# Patient Record
Sex: Female | Born: 1985 | Race: Black or African American | Hispanic: No | Marital: Married | Smoking: Never smoker
Health system: Southern US, Community
[De-identification: ages and names within clinical notes are randomized; demographics above are authoritative.]

## PROBLEM LIST (undated history)

## (undated) ENCOUNTER — Inpatient Hospital Stay (HOSPITAL_COMMUNITY): Payer: Self-pay

## (undated) DIAGNOSIS — O24419 Gestational diabetes mellitus in pregnancy, unspecified control: Secondary | ICD-10-CM

## (undated) DIAGNOSIS — J309 Allergic rhinitis, unspecified: Secondary | ICD-10-CM

## (undated) DIAGNOSIS — IMO0002 Reserved for concepts with insufficient information to code with codable children: Secondary | ICD-10-CM

## (undated) DIAGNOSIS — Z309 Encounter for contraceptive management, unspecified: Secondary | ICD-10-CM

## (undated) DIAGNOSIS — Z973 Presence of spectacles and contact lenses: Secondary | ICD-10-CM

## (undated) DIAGNOSIS — N946 Dysmenorrhea, unspecified: Secondary | ICD-10-CM

## (undated) DIAGNOSIS — E669 Obesity, unspecified: Secondary | ICD-10-CM

## (undated) DIAGNOSIS — I10 Essential (primary) hypertension: Secondary | ICD-10-CM

## (undated) DIAGNOSIS — J811 Chronic pulmonary edema: Secondary | ICD-10-CM

## (undated) DIAGNOSIS — K219 Gastro-esophageal reflux disease without esophagitis: Secondary | ICD-10-CM

## (undated) DIAGNOSIS — R87619 Unspecified abnormal cytological findings in specimens from cervix uteri: Secondary | ICD-10-CM

## (undated) HISTORY — DX: Dysmenorrhea, unspecified: N94.6

## (undated) HISTORY — PX: WISDOM TOOTH EXTRACTION: SHX21

## (undated) HISTORY — DX: Encounter for contraceptive management, unspecified: Z30.9

## (undated) HISTORY — DX: Allergic rhinitis, unspecified: J30.9

## (undated) HISTORY — DX: Obesity, unspecified: E66.9

## (undated) HISTORY — DX: Gestational diabetes mellitus in pregnancy, unspecified control: O24.419

## (undated) HISTORY — DX: Presence of spectacles and contact lenses: Z97.3

## (undated) HISTORY — DX: Chronic pulmonary edema: J81.1

---

## 2006-09-03 ENCOUNTER — Emergency Department (HOSPITAL_COMMUNITY): Admission: EM | Admit: 2006-09-03 | Discharge: 2006-09-04 | Payer: Self-pay | Admitting: Emergency Medicine

## 2010-03-11 ENCOUNTER — Emergency Department (HOSPITAL_COMMUNITY): Admission: EM | Admit: 2010-03-11 | Discharge: 2010-03-11 | Payer: Self-pay | Admitting: Emergency Medicine

## 2010-04-02 ENCOUNTER — Ambulatory Visit (HOSPITAL_COMMUNITY): Admission: RE | Admit: 2010-04-02 | Discharge: 2010-04-02 | Payer: Self-pay | Admitting: Obstetrics

## 2010-05-28 ENCOUNTER — Ambulatory Visit (HOSPITAL_COMMUNITY): Admission: RE | Admit: 2010-05-28 | Discharge: 2010-05-28 | Payer: Self-pay | Admitting: Obstetrics

## 2010-06-06 ENCOUNTER — Inpatient Hospital Stay (HOSPITAL_COMMUNITY): Admission: AD | Admit: 2010-06-06 | Discharge: 2010-06-07 | Payer: Self-pay | Admitting: Obstetrics & Gynecology

## 2010-06-14 ENCOUNTER — Ambulatory Visit (HOSPITAL_COMMUNITY): Admission: RE | Admit: 2010-06-14 | Discharge: 2010-06-14 | Payer: Self-pay | Admitting: Obstetrics

## 2010-07-20 ENCOUNTER — Inpatient Hospital Stay (HOSPITAL_COMMUNITY): Admission: AD | Admit: 2010-07-20 | Discharge: 2010-07-21 | Payer: Self-pay | Admitting: Obstetrics

## 2010-10-11 DIAGNOSIS — J811 Chronic pulmonary edema: Secondary | ICD-10-CM

## 2010-10-11 HISTORY — DX: Chronic pulmonary edema: J81.1

## 2010-10-26 ENCOUNTER — Inpatient Hospital Stay (HOSPITAL_COMMUNITY)
Admission: AD | Admit: 2010-10-26 | Discharge: 2010-10-29 | Payer: Self-pay | Source: Home / Self Care | Attending: Obstetrics | Admitting: Obstetrics

## 2010-11-01 ENCOUNTER — Emergency Department (HOSPITAL_COMMUNITY)
Admission: EM | Admit: 2010-11-01 | Discharge: 2010-11-01 | Payer: Self-pay | Source: Home / Self Care | Admitting: Emergency Medicine

## 2010-11-02 ENCOUNTER — Inpatient Hospital Stay (HOSPITAL_COMMUNITY)
Admission: AD | Admit: 2010-11-02 | Discharge: 2010-11-03 | Payer: Self-pay | Attending: Obstetrics | Admitting: Obstetrics

## 2011-01-21 LAB — CBC
HCT: 33.8 % — ABNORMAL LOW (ref 36.0–46.0)
Hemoglobin: 11.5 g/dL — ABNORMAL LOW (ref 12.0–15.0)
Hemoglobin: 11.1 g/dL — ABNORMAL LOW (ref 12.0–15.0)
MCH: 29.5 pg (ref 26.0–34.0)
MCHC: 34.6 g/dL (ref 30.0–36.0)
MCHC: 32.8 g/dL (ref 30.0–36.0)
MCHC: 33.5 g/dL (ref 30.0–36.0)
MCV: 89.9 fL (ref 78.0–100.0)
RBC: 3.76 MIL/uL — ABNORMAL LOW (ref 3.87–5.11)
RDW: 12.1 % (ref 11.5–15.5)
RDW: 12.7 % (ref 11.5–15.5)
WBC: 9.3 10*3/uL (ref 4.0–10.5)

## 2011-01-21 LAB — COMPREHENSIVE METABOLIC PANEL
ALT: 31 U/L (ref 0–35)
AST: 25 U/L (ref 0–37)
Alkaline Phosphatase: 122 U/L — ABNORMAL HIGH (ref 39–117)
BUN: 7 mg/dL (ref 6–23)
Calcium: 8.8 mg/dL (ref 8.4–10.5)
Chloride: 111 mEq/L (ref 96–112)
Creatinine, Ser: 0.71 mg/dL (ref 0.4–1.2)
Glucose, Bld: 77 mg/dL (ref 70–99)
Total Protein: 5.9 g/dL — ABNORMAL LOW (ref 6.0–8.3)

## 2011-01-21 LAB — DIFFERENTIAL
Basophils Absolute: 0 10*3/uL (ref 0.0–0.1)
Eosinophils Absolute: 0.4 10*3/uL (ref 0.0–0.7)
Eosinophils Relative: 6 % — ABNORMAL HIGH (ref 0–5)
Lymphocytes Relative: 31 % (ref 12–46)

## 2011-01-21 LAB — URINALYSIS, ROUTINE W REFLEX MICROSCOPIC
Bilirubin Urine: NEGATIVE
Ketones, ur: NEGATIVE mg/dL
Nitrite: NEGATIVE
Protein, ur: NEGATIVE mg/dL
pH: 7 (ref 5.0–8.0)

## 2011-01-21 LAB — URINE MICROSCOPIC-ADD ON

## 2011-01-21 LAB — D-DIMER, QUANTITATIVE: D-Dimer, Quant: 0.97 ug/mL-FEU — ABNORMAL HIGH (ref 0.00–0.48)

## 2011-01-24 LAB — URINALYSIS, ROUTINE W REFLEX MICROSCOPIC
Hgb urine dipstick: NEGATIVE
Specific Gravity, Urine: >1.03 — ABNORMAL HIGH (ref 1.005–1.030)

## 2011-01-24 LAB — WET PREP, GENITAL: Clue Cells Wet Prep HPF POC: NONE SEEN

## 2011-01-26 LAB — URINALYSIS, ROUTINE W REFLEX MICROSCOPIC
Hgb urine dipstick: NEGATIVE
Ketones, ur: 40 mg/dL — AB
Nitrite: NEGATIVE
Specific Gravity, Urine: >1.03 — ABNORMAL HIGH (ref 1.005–1.030)
Urobilinogen, UA: 0.2 mg/dL (ref 0.0–1.0)

## 2011-01-29 LAB — COMPREHENSIVE METABOLIC PANEL
AST: 18 U/L (ref 0–37)
CO2: 26 mEq/L (ref 19–32)
GFR calc non Af Amer: >60 mL/min (ref >60)
Potassium: 3.3 mEq/L — ABNORMAL LOW (ref 3.5–5.1)
Sodium: 131 mEq/L — ABNORMAL LOW (ref 135–145)
Total Bilirubin: 0.5 mg/dL (ref 0.3–1.2)

## 2011-01-29 LAB — URINALYSIS, ROUTINE W REFLEX MICROSCOPIC
Ketones, ur: 15 mg/dL — AB
Nitrite: NEGATIVE
Specific Gravity, Urine: 1.027 (ref 1.005–1.030)
pH: 6.5 (ref 5.0–8.0)

## 2011-01-29 LAB — DIFFERENTIAL
Basophils Absolute: 0 10*3/uL (ref 0.0–0.1)
Basophils Relative: 1 % (ref 0–1)
Lymphocytes Relative: 21 % (ref 12–46)
Monocytes Relative: 8 % (ref 3–12)
Neutro Abs: 4.7 10*3/uL (ref 1.7–7.7)
Neutrophils Relative %: 63 % (ref 43–77)

## 2011-01-29 LAB — CBC
HCT: 37.2 % (ref 36.0–46.0)
MCHC: 34.7 g/dL (ref 30.0–36.0)
MCV: 92.3 fL (ref 78.0–100.0)
Platelets: 226 10*3/uL (ref 150–400)
RBC: 4.03 MIL/uL (ref 3.87–5.11)

## 2011-02-26 ENCOUNTER — Emergency Department (HOSPITAL_COMMUNITY): Payer: Medicaid Other

## 2011-02-26 ENCOUNTER — Emergency Department (HOSPITAL_COMMUNITY)
Admission: EM | Admit: 2011-02-26 | Discharge: 2011-02-27 | Disposition: A | Discharge status: Home / Self Care | Payer: Medicaid Other | Source: Home / Self Care | Attending: Emergency Medicine | Admitting: Emergency Medicine

## 2011-02-26 DIAGNOSIS — R079 Chest pain, unspecified: Secondary | ICD-10-CM | POA: Insufficient documentation

## 2011-02-26 DIAGNOSIS — R0609 Other forms of dyspnea: Secondary | ICD-10-CM | POA: Insufficient documentation

## 2011-02-26 DIAGNOSIS — R059 Cough, unspecified: Secondary | ICD-10-CM | POA: Insufficient documentation

## 2011-02-26 DIAGNOSIS — R05 Cough: Secondary | ICD-10-CM | POA: Insufficient documentation

## 2011-02-26 DIAGNOSIS — R0989 Other specified symptoms and signs involving the circulatory and respiratory systems: Secondary | ICD-10-CM | POA: Insufficient documentation

## 2011-02-27 ENCOUNTER — Emergency Department (HOSPITAL_COMMUNITY)
Admission: RE | Admit: 2011-02-27 | Discharge: 2011-02-27 | Disposition: A | Discharge status: Home / Self Care | Payer: Medicaid Other | Source: Ambulatory Visit | Attending: Emergency Medicine | Admitting: Emergency Medicine

## 2011-02-27 ENCOUNTER — Emergency Department (HOSPITAL_COMMUNITY): Admit: 2011-02-27 | Payer: Medicaid Other

## 2011-02-27 DIAGNOSIS — R079 Chest pain, unspecified: Secondary | ICD-10-CM | POA: Insufficient documentation

## 2011-02-27 DIAGNOSIS — R059 Cough, unspecified: Secondary | ICD-10-CM | POA: Insufficient documentation

## 2011-02-27 DIAGNOSIS — R0602 Shortness of breath: Secondary | ICD-10-CM | POA: Insufficient documentation

## 2011-02-27 DIAGNOSIS — R05 Cough: Secondary | ICD-10-CM | POA: Insufficient documentation

## 2011-02-27 DIAGNOSIS — R0609 Other forms of dyspnea: Secondary | ICD-10-CM | POA: Insufficient documentation

## 2011-02-27 DIAGNOSIS — R0989 Other specified symptoms and signs involving the circulatory and respiratory systems: Secondary | ICD-10-CM | POA: Insufficient documentation

## 2011-02-27 LAB — POCT I-STAT, CHEM 8
Chloride: 103 mEq/L (ref 96–112)
Creatinine, Ser: 0.8 mg/dL (ref 0.4–1.2)
HCT: 39 % (ref 36.0–46.0)
Hemoglobin: 13.3 g/dL (ref 12.0–15.0)
Potassium: 4 mEq/L (ref 3.5–5.1)
Sodium: 140 mEq/L (ref 135–145)

## 2011-02-27 MED ORDER — IOHEXOL 300 MG/ML  SOLN
100.0000 mL | Freq: Once | INTRAMUSCULAR | Status: AC | PRN
  Administered 2011-02-27: 100 mL via INTRAVENOUS

## 2011-10-22 ENCOUNTER — Encounter: Payer: Self-pay | Admitting: *Deleted

## 2011-10-22 ENCOUNTER — Emergency Department (INDEPENDENT_AMBULATORY_CARE_PROVIDER_SITE_OTHER)
Admission: EM | Admit: 2011-10-22 | Discharge: 2011-10-22 | Disposition: A | Discharge status: Home / Self Care | Payer: Self-pay | Source: Home / Self Care | Attending: Family Medicine | Admitting: Family Medicine

## 2011-10-22 DIAGNOSIS — G5601 Carpal tunnel syndrome, right upper limb: Secondary | ICD-10-CM

## 2011-10-22 DIAGNOSIS — G5600 Carpal tunnel syndrome, unspecified upper limb: Secondary | ICD-10-CM

## 2011-10-22 HISTORY — DX: Gastro-esophageal reflux disease without esophagitis: K21.9

## 2011-10-22 NOTE — ED Notes (Signed)
3 weeks of right hand tingling/pain, especially in the middle and ring fingers, the tingling radiates to the right elbow.     The pain is worse at night, when it wakes her up while sleeping.  Denies recent injury.  Pt does use a keyboard at work

## 2011-10-23 NOTE — ED Provider Notes (Signed)
History     CSN: [id number] Arrival date & time: 10/22/2011 11:20 AM   First MD Initiated Contact with Patient 10/22/11 1200      Chief Complaint  Patient presents with  . Hand Pain    (Consider location/radiation/quality/duration/timing/severity/associated sxs/prior treatment) HPI Comments: HPI Comments: 25 y/o right handed female here c/o tingling type of pain in right wrist present for 3 weeks,  radiation downward to complete middle and ring finger in palmar area and distal phalanges dorsally. Upward radiation to medial right elbow. Tingling has been associated with numbness in same areas. No weakness but pain worse with wrist movent while picking up objects. No color or temperature changes. Works in Education officer, environmental all day. No prior episodes. No known recent injury. Pt has a history of right wrist fracture several years ago as per her report.    Past Medical History  Diagnosis Date  . GERD (gastroesophageal reflux disease)     Past Surgical History  Procedure Date  . Cesarean section     No family history on file.  History  Substance Use Topics  . Smoking status: Never Smoker   . Smokeless tobacco: Not on file  . Alcohol Use: Yes     occasional    OB History    Grav Para Term Preterm Abortions TAB SAB Ect Mult Living                  Review of Systems  Constitutional: Negative.   HENT: Negative for congestion, neck pain and neck stiffness.   Respiratory: Negative for cough, chest tightness and shortness of breath.   Skin: Negative for rash.  Neurological: Negative for weakness and headaches.       Intermittent paresthesias, tingling and numbness of right hand    Allergies  Review of patient's allergies indicates no known allergies.  Home Medications   Current Outpatient Rx  Name Route Sig Dispense Refill  . IBUPROFEN 800 MG PO TABS Oral Take 800 mg by mouth every 8 (eight) hours as needed.      Marland Kitchen OMEPRAZOLE 20 MG PO CPDR Oral Take 20 mg by  mouth daily.        BP 126/58  Pulse 99  Temp(Src) 98 F (36.7 C) (Oral)  Resp 20  SpO2 100%  LMP 07/22/2011  Physical Exam  Nursing note and vitals reviewed. Constitutional: She is oriented to person, place, and time. She appears well-developed and well-nourished. No distress.  HENT:  Head: Normocephalic and atraumatic.  Eyes: EOM are normal. Pupils are equal, round, and reactive to light.  Neck: Normal range of motion. Neck supple.       Negative spurling  Cardiovascular: Normal rate, regular rhythm and normal heart sounds.   Pulmonary/Chest: Breath sounds normal. She has no wheezes. She has no rales. She exhibits no tenderness.  Musculoskeletal:       Right Wrist: no obvious deformity, swelling or erythema. Full range of motion. Normal musculoskeletal exam of the right hand  Neurological: She is alert and oriented to person, place, and time.       Right hand: Positive phalens test. With reported pain at inner wrist with tingling sensation in 3th and 4th finger with complete palmar extension and dorsally to distal phalanges. No grip weakness compared with left but discomfort. Also exacerbation of tingling by tapping over inner wrist.  Skin: No rash noted.    ED Course  Procedures (including critical care time)  Labs Reviewed - No data to  display No results found.   1. Carpal tunnel syndrome of right wrist       MDM  wrist splint was placed. Treated with ibuprofen. Referral for hand ortho specialist given in case of persistent symptoms.        Sharin Grave, MD 10/24/11 [phone]

## 2012-01-29 ENCOUNTER — Encounter (HOSPITAL_COMMUNITY): Payer: Self-pay | Admitting: Emergency Medicine

## 2012-01-29 ENCOUNTER — Other Ambulatory Visit: Payer: Self-pay

## 2012-01-29 ENCOUNTER — Emergency Department (HOSPITAL_COMMUNITY)
Admission: EM | Admit: 2012-01-29 | Discharge: 2012-01-30 | Disposition: A | Discharge status: Home / Self Care | Payer: Self-pay | Attending: Emergency Medicine | Admitting: Emergency Medicine

## 2012-01-29 DIAGNOSIS — I498 Other specified cardiac arrhythmias: Secondary | ICD-10-CM | POA: Insufficient documentation

## 2012-01-29 DIAGNOSIS — R000 Tachycardia, unspecified: Secondary | ICD-10-CM

## 2012-01-29 DIAGNOSIS — I10 Essential (primary) hypertension: Secondary | ICD-10-CM | POA: Insufficient documentation

## 2012-01-29 DIAGNOSIS — K219 Gastro-esophageal reflux disease without esophagitis: Secondary | ICD-10-CM | POA: Insufficient documentation

## 2012-01-29 LAB — DIFFERENTIAL
Lymphs Abs: 2.6 10*3/uL (ref 0.7–4.0)
Monocytes Relative: 8 % (ref 3–12)
Neutro Abs: 3.8 10*3/uL (ref 1.7–7.7)
Neutrophils Relative %: 51 % (ref 43–77)

## 2012-01-29 LAB — CBC
Hemoglobin: 12.8 g/dL (ref 12.0–15.0)
RBC: 4.6 MIL/uL (ref 3.87–5.11)

## 2012-01-29 NOTE — ED Notes (Signed)
PT. CHECKED HER BLOOD PRESSURE AT [facility] THIS EVENING = 164/92 WITH PALPITATIONS , FEELS "TIRED' AND PRESSURE ON BOTH EARS.

## 2012-01-30 LAB — URINALYSIS, ROUTINE W REFLEX MICROSCOPIC
Bilirubin Urine: NEGATIVE
Hgb urine dipstick: NEGATIVE
Nitrite: NEGATIVE
Specific Gravity, Urine: 1.024 (ref 1.005–1.030)
pH: 5.5 (ref 5.0–8.0)

## 2012-01-30 LAB — BASIC METABOLIC PANEL
BUN: 11 mg/dL (ref 6–23)
CO2: 23 mEq/L (ref 19–32)
Chloride: 101 mEq/L (ref 96–112)
GFR calc Af Amer: >90 mL/min (ref >90)
Glucose, Bld: 103 mg/dL — ABNORMAL HIGH (ref 70–99)
Potassium: 3.5 mEq/L (ref 3.5–5.1)

## 2012-01-30 LAB — TSH: TSH: <0.008 u[IU]/mL — ABNORMAL LOW (ref 0.350–4.500)

## 2012-01-30 LAB — D-DIMER, QUANTITATIVE: D-Dimer, Quant: 0.34 ug/mL-FEU (ref 0.00–0.48)

## 2012-01-30 MED ORDER — SODIUM CHLORIDE 0.9 % IV BOLUS (SEPSIS)
1000.0000 mL | Freq: Once | INTRAVENOUS | Status: AC
  Administered 2012-01-30: 1000 mL via INTRAVENOUS

## 2012-01-30 MED ORDER — METOPROLOL TARTRATE 1 MG/ML IV SOLN
5.0000 mg | Freq: Once | INTRAVENOUS | Status: AC
  Administered 2012-01-30: 5 mg via INTRAVENOUS
  Filled 2012-01-30: qty 5

## 2012-01-30 MED ORDER — METOPROLOL TARTRATE 25 MG PO TABS: 25.0000 mg | ORAL_TABLET | Freq: Two times a day (BID) | ORAL | Status: DC

## 2012-01-30 MED ORDER — SODIUM CHLORIDE 0.9 % IV SOLN: INTRAVENOUS | Status: DC

## 2012-01-30 MED ORDER — METOPROLOL TARTRATE 25 MG PO TABS
25.0000 mg | ORAL_TABLET | Freq: Once | ORAL | Status: AC
  Administered 2012-01-30: 25 mg via ORAL
  Filled 2012-01-30: qty 1

## 2012-01-30 NOTE — Discharge Instructions (Signed)
Take medications as prescribed.  You have apparent sinus tachycardia, a faster than normal heart rate with otherwise normal conduction of electricity through the heart and no serious underlying cause could be found.  One test, a TSH level, is currently in progress.  Follow up with your primary care physician is very important to see if the TSH is low, indicating an overactive thyroid gland as the problem.  Otherwise, your doctor may have to perform more tests to determine the cause of your elevated heart rate.  If you develop chest pain, irregular heartbeat, shortness of breath, lightheadedness or feeling like you're going to pass out, return to the emergency department right away for further evaluation.  Arrhythmia Categories Your heart is a muscle that works to pump blood through your body by regular contractions. The beating of your heart is controlled by a system of special pacemaker cells. These cells control the electrical activity of the heart. When the system controlling this regular beating is disturbed, a heart rhythm abnormality (arrhythmia) results. WHEN YOUR HEART SKIPS A BEAT One of the most common and least serious heart arrhythmias is called an ectopic or premature atrial heartbeat (PAC). This may be noticed as a small change in your regular pulse. A PAC originates from the top part (atrium) of the heart. Within the right atrium, the SA node is the area that normally controls the regularity of the heart. PACs occur in heart tissue outside of the SA node region. You may feel this as a skipped beat or heart flutter, especially if several occur in succession or occur frequently.  Another arrhythmia is ventricular premature complex (VCP or PVC). These extra beats start out in the bottom, more muscular chambers of the heart. In most cases a PVC is harmless. If there are underlying causes that are making the heart irritable such as an overactive thyroid or a prior heart attack PVCs may be of more  concern. In a few cases, medications to control the heart rhythm may be prescribed. Things to try at home:  Cut down or avoid alcohol, tobacco and caffeine.   Get enough sleep.   Reduce stress.   Exercise more.  WHEN THE HEART BEATS TOO FAST Atrial tachycardia is a fast heart rate, which starts out in the atrium. It may last from minutes to much longer. Your heart may beat 140 to 240 times per minute instead of the normal 60 to 100.  Symptoms include a worried feeling (anxiety) and a sense that your heart is beating fast and hard.   You may be able to stop the fast rate by holding your breath or bearing down as if you were going to have a bowel movement.   This type of fast rate is usually not dangerous.  Atrial fibrillation and atrial flutter are other fast rhythms that start in the atria. Both conditions keep the atria from filling with enough blood so the heart does not work well.  Symptoms include feeling lightheaded or faint.   These fast rates may be the result of heart damage or disease. Too much thyroid hormone may play a role.   There may be no clear cause or it may be from heart disease or damage.   Medication or a special electrical treatment (cardioversion) may be needed to get the heart beating normally.  Ventricular tachycardia is a fast heart rate that starts in the lower muscular chambers (ventricles) This is a serious disorder that requires treatment as soon as possible. You need someone  else to get and use a small defibrillator.  Symptoms include collapse, chest pain, or being short of breath.   Treatment may include medication, procedures to improve blood flow to the heart, or an implantable cardiac defibrillator (ICD).  DIAGNOSIS   A cardiogram (EKG or ECG) will be done to see the arrhythmia, as well as lab tests to check the underlying cause.   If the extra beats or fast rate come and go, you may wear a Holter monitor that records your heart rate for a longer  period of time.  HOME CARE INSTRUCTIONS  If your caregiver has given you a follow-up appointment, it is very important to keep that appointment. Not keeping the appointment could result in a heart attack, permanent injury, pain, and disability. If there is any problem keeping the appointment, you must call back to this facility for assistance.  SEEK MEDICAL CARE IF:  You have irregular or fast heartbeats (palpitations).   You experience skipped beats.   You develop lightheadedness.   You have chest discomfort.   You develop shortness of breath.   You have more frequent episodes, if you are already being treated.  SEEK IMMEDIATE MEDICAL CARE IF:   You have severe chest pain, especially if the pain is crushing or pressure-like and spreads to the arms, back, neck, or jaw, or if you have sweating, feeling sick to your stomach (nausea), or shortness of breath. THIS IS AN EMERGENCY. Do not wait to see if the pain will go away. Get medical help at once. Call 911 or 0 (operator). DO NOT drive yourself to the hospital.   You feel dizzy or faint.   You have episodes of previously documented atrial tachycardia that do not resolve with the techniques your caregiver has taught you.   Irregular or rapid heartbeats begin to occur more often than in the past, especially if they are associated with more pronounced symptoms or of longer duration.  Document Released: 10/28/2005 Document Revised: 10/17/2011 Document Reviewed: 03/12/2007 [employer] Patient Information 2012 Exit[employer], LLCArterial Hypertension Arterial hypertension (high blood pressure) is a condition of elevated pressure in your blood vessels. Hypertension over a long period of time is a risk factor for strokes, heart attacks, and heart failure. It is also the leading cause of kidney (renal) failure.  CAUSES   In Adults -- Over 90% of all hypertension has no known cause. This is called essential or primary hypertension. In the other 10% of  people with hypertension, the increase in blood pressure is caused by another disorder. This is called secondary hypertension. Important causes of secondary hypertension are:   Heavy alcohol use.   Obstructive sleep apnea.   Hyperaldosterosim (Conn's syndrome).   Steroid use.   Chronic kidney failure.   Hyperparathyroidism.   Medications.   Renal artery stenosis.   Pheochromocytoma.   Cushing's disease.   Coarctation of the aorta.   Scleroderma renal crisis.   Licorice (in excessive amounts).   Drugs (cocaine, methamphetamine).  Your caregiver can explain any items above that apply to you.  In Children -- Secondary hypertension is more common and should always be considered.   Pregnancy -- Few women of childbearing age have high blood pressure. However, up to 10% of them develop hypertension of pregnancy. Generally, this will not harm the woman. It may be a sign of 3 complications of pregnancy: preeclampsia, HELLP syndrome, and eclampsia. Follow up and control with medication is necessary.  SYMPTOMS   This condition normally does not produce any  noticeable symptoms. It is usually found during a routine exam.   Malignant hypertension is a late problem of high blood pressure. It may have the following symptoms:   Headaches.   Blurred vision.   End-organ damage (this means your kidneys, heart, lungs, and other organs are being damaged).   Stressful situations can increase the blood pressure. If a person with normal blood pressure has their blood pressure go up while being seen by their caregiver, this is often termed "white coat hypertension." Its importance is not known. It may be related with eventually developing hypertension or complications of hypertension.   Hypertension is often confused with mental tension, stress, and anxiety.  DIAGNOSIS  The diagnosis is made by 3 separate blood pressure measurements. They are taken at least 1 week apart from each other. If  there is organ damage from hypertension, the diagnosis may be made without repeat measurements. Hypertension is usually identified by having blood pressure readings:  Above 140/90 mmHg measured in both arms, at 3 separate times, over a couple weeks.   Over 130/80 mmHg should be considered a risk factor and may require treatment in patients with diabetes.  Blood pressure readings over 120/80 mmHg are called "pre-hypertension" even in non-diabetic patients. To get a true blood pressure measurement, use the following guidelines. Be aware of the factors that can alter blood pressure readings.  Take measurements at least 1 hour after caffeine.   Take measurements 30 minutes after smoking and without any stress. This is another reason to quit smoking - it raises your blood pressure.   Use a proper cuff size. Ask your caregiver if you are not sure about your cuff size.   Most home blood pressure cuffs are automatic. They will measure systolic and diastolic pressures. The systolic pressure is the pressure reading at the start of sounds. Diastolic pressure is the pressure at which the sounds disappear. If you are elderly, measure pressures in multiple postures. Try sitting, lying or standing.   Sit at rest for a minimum of 5 minutes before taking measurements.   You should not be on any medications like decongestants. These are found in many cold medications.   Record your blood pressure readings and review them with your caregiver.  If you have hypertension:  Your caregiver may do tests to be sure you do not have secondary hypertension (see "causes" above).   Your caregiver may also look for signs of metabolic syndrome. This is also called Syndrome X or Insulin Resistance Syndrome. You may have this syndrome if you have type 2 diabetes, abdominal obesity, and abnormal blood lipids in addition to hypertension.   Your caregiver will take your medical and family history and perform a physical exam.     Diagnostic tests may include blood tests (for glucose, cholesterol, potassium, and kidney function), a urinalysis, or an EKG. Other tests may also be necessary depending on your condition.  PREVENTION  There are important lifestyle issues that you can adopt to reduce your chance of developing hypertension:  Maintain a normal weight.   Limit the amount of salt (sodium) in your diet.   Exercise often.   Limit alcohol intake.   Get enough potassium in your diet. Discuss specific advice with your caregiver.   Follow a DASH diet (dietary approaches to stop hypertension). This diet is rich in fruits, vegetables, and low-fat dairy products, and avoids certain fats.  PROGNOSIS  Essential hypertension cannot be cured. Lifestyle changes and medical treatment can lower blood pressure  and reduce complications. The prognosis of secondary hypertension depends on the underlying cause. Many people whose hypertension is controlled with medicine or lifestyle changes can live a normal, healthy life.  RISKS AND COMPLICATIONS  While high blood pressure alone is not an illness, it often requires treatment due to its short- and long-term effects on many organs. Hypertension increases your risk for:  CVAs or strokes (cerebrovascular accident).   Heart failure due to chronically high blood pressure (hypertensive cardiomyopathy).   Heart attack (myocardial infarction).   Damage to the retina (hypertensive retinopathy).   Kidney failure (hypertensive nephropathy).  Your caregiver can explain list items above that apply to you. Treatment of hypertension can significantly reduce the risk of complications. TREATMENT   For overweight patients, weight loss and regular exercise are recommended. Physical fitness lowers blood pressure.   Mild hypertension is usually treated with diet and exercise. A diet rich in fruits and vegetables, fat-free dairy products, and foods low in fat and salt (sodium) can help lower  blood pressure. Decreasing salt intake decreases blood pressure in a 1/3 of people.   Stop smoking if you are a smoker.  The steps above are highly effective in reducing blood pressure. While these actions are easy to suggest, they are difficult to achieve. Most patients with moderate or severe hypertension end up requiring medications to bring their blood pressure down to a normal level. There are several classes of medications for treatment. Blood pressure pills (antihypertensives) will lower blood pressure by their different actions. Lowering the blood pressure by 10 mmHg may decrease the risk of complications by as much as 25%. The goal of treatment is effective blood pressure control. This will reduce your risk for complications. Your caregiver will help you determine the best treatment for you according to your lifestyle. What is excellent treatment for one person, may not be for you. HOME CARE INSTRUCTIONS   Do not smoke.   Follow the lifestyle changes outlined in the "Prevention" section.   If you are on medications, follow the directions carefully. Blood pressure medications must be taken as prescribed. Skipping doses reduces their benefit. It also puts you at risk for problems.   Follow up with your caregiver, as directed.   If you are asked to monitor your blood pressure at home, follow the guidelines in the "Diagnosis" section above.  SEEK MEDICAL CARE IF:   You think you are having medication side effects.   You have recurrent headaches or lightheadedness.   You have swelling in your ankles.   You have trouble with your vision.  SEEK IMMEDIATE MEDICAL CARE IF:   You have sudden onset of chest pain or pressure, difficulty breathing, or other symptoms of a heart attack.   You have a severe headache.   You have symptoms of a stroke (such as sudden weakness, difficulty speaking, difficulty walking).  MAKE SURE YOU:   Understand these instructions.   Will watch your  condition.   Will get help right away if you are not doing well or get worse.  Document Released: 10/28/2005 Document Revised: 10/17/2011 Document Reviewed: 05/28/2007 [facility] Patient Information 2012 [place], [place].Cardiac Arrhythmia Your heart is a muscle that works to pump blood through your body by regular contractions. The beating of your heart is controlled by a system of special pacemaker cells. These cells control the electrical activity of the heart. When the system controlling this regular beating is disturbed, a heart rhythm abnormality (arrhythmia) results. WHEN YOUR HEART SKIPS A BEAT One  of the most common and least serious heart arrhythmias is called an ectopic or premature atrial heartbeat (PAC). This may be noticed as a small change in your regular pulse. A PAC originates from the top part (atrium) of the heart. Within the right atrium, the SA node is the area that normally controls the regularity of the heart. PACs occur in heart tissue outside of the SA node region. You may feel this as a skipped beat or heart flutter, especially if several occur in succession or occur frequently.  Another arrhythmia is ventricular premature complex (VCP or PVC). These extra beats start out in the bottom, more muscular chambers of the heart. In most cases a PVC is harmless. If there are underlying causes that are making the heart irritable such as an overactive thyroid or a prior heart attack PVCs may be of more concern. In a few cases, medications to control the heart rhythm may be prescribed. Things to try at home:  Cut down or avoid alcohol, tobacco and caffeine.   Get enough sleep.   Reduce stress.   Exercise more.  WHEN THE HEART BEATS TOO FAST Atrial tachycardia is a fast heart rate, which starts out in the atrium. It may last from minutes to much longer. Your heart may beat 140 to 240 times per minute instead of the normal 60 to 100.  Symptoms include a worried feeling (anxiety) and a  sense that your heart is beating fast and hard.   You may be able to stop the fast rate by holding your breath or bearing down as if you were going to have a bowel movement.   This type of fast rate is usually not dangerous.  Atrial fibrillation and atrial flutter are other fast rhythms that start in the atria. Both conditions keep the atria from filling with enough blood so the heart does not work well.  Symptoms include feeling light-headed or faint.   These fast rates may be the result of heart damage or disease. Too much thyroid hormone may play a role.   There may be no clear cause or it may be from heart disease or damage.   Medication or a special electrical treatment (cardioversion) may be needed to get the heart beating normally.  Ventricular tachycardia is a fast heart rate that starts in the lower muscular chambers (ventricles) This is a serious disorder that requires treatment as soon as possible. You need someone else to get and use a small defibrillator.  Symptoms include collapse, chest pain, or being short of breath.   Treatment may include medication, procedures to improve blood flow to the heart, or an implantable cardiac defibrillator (ICD).  DIAGNOSIS   A cardiogram (EKG or ECG) will be done to see the arrhythmia, as well as lab tests to check the underlying cause.   If the extra beats or fast rate come and go, you may wear a Holter monitor that records your heart rate for a longer period of time.  SEEK MEDICAL CARE IF:  You have irregular or fast heartbeats (palpitations).   You experience skipped beats.   You develop lightheadedness.   You have chest discomfort.   You have shortness of breath.   You have more frequent episodes, if you are already being treated.  SEEK IMMEDIATE MEDICAL CARE IF:   You have severe chest pain, especially if the pain is crushing or pressure-like and spreads to the arms, back, neck, or jaw, or if you have sweating, feeling sick  to your stomach (nausea), or shortness of breath. THIS IS AN EMERGENCY. Do not wait to see if the pain will go away. Get medical help at once. Call 911 or 0 (operator). DO NOT drive yourself to the hospital.   You feel dizzy or faint.   You have episodes of previously documented atrial tachycardia that do not resolve with the techniques your caregiver has taught you.   Irregular or rapid heartbeats begin to occur more often than in the past, especially if they are associated with more pronounced symptoms or of longer duration.  Document Released: 10/28/2005 Document Revised: 10/17/2011 Document Reviewed: 06/15/2008 [facility] Patient Information 2012 [place], [place].Tachycardia, Nonspecific In adults, the heart normally beats between 60 and 100 times a minute. A heart rate over 100 is called tachycardia. When your heart beats too fast, it may not be able to pump enough blood to the rest of the body. CAUSES   Exercise or exertion.   Fever.   Pain or injury.   Infection.   Loss of fluid (dehydration).   Overactive thyroid.   Lack of red blood cells (anemia).   Anxiety.   Alcohol.   Heart arrhythmia.   Caffeine.   Tobacco products.   Diet pills.   Street drugs.   Heart disease.  SYMPTOMS  Palpitations (rapid or irregular heartbeat).   Dizziness.   Tiredness (fatigue).   Shortness of breath.  DIAGNOSIS  After an exam and taking a history, your caregiver may order:  Blood tests.   Electrocardiogram (EKG).   Heart monitor.  TREATMENT  Treatment will depend on the cause and potential for harm. It may include:  Intravenous (IV) replacement of fluids or blood.   Antidote or reversal medicines.   Changes in your present medicines.   Lifestyle changes.  HOME CARE INSTRUCTIONS   Get rest.   Drink enough water and fluids to keep your urine clear or pale yellow.   Avoid:   Caffeine.   Nicotine.   Alcohol.   Stress.   Chocolate.   Stimulants.    Only take medicine as directed by your caregiver.  SEEK IMMEDIATE MEDICAL CARE IF:   You have pain in your chest, upper arms, jaw, or neck.   You become weak, dizzy, or feel faint.   You have palpitations that will not go away.   You throw up (vomit), have diarrhea, or pass blood.   You look pale and your skin is cool and wet.  MAKE SURE YOU:   Understand these instructions.   Will watch your condition.   Will get help right away if you are not doing well or get worse.  Document Released: 12/05/2004 Document Revised: 10/17/2011 Document Reviewed: 10/08/2011 [facility] Patient Information 2012 [place], [place].Marland Kitchen

## 2012-01-30 NOTE — ED Notes (Signed)
Pt reports having increase in blood pressure, states "my blood pressure was up all day, and I thought I would go to sleep. I couldn't sleep and I felt pressure behind my ears. I went to [facility] and my pressure was up and then i came here" . Pt denies any other s/s, shortness of breath, headache or chest  Pain.

## 2012-02-02 NOTE — ED Provider Notes (Signed)
History     CSN: [id number]  Arrival date & time 01/29/12  2325   First MD Initiated Contact with Patient 01/30/12 [phone]      Chief Complaint  Patient presents with  . Hypertension    (Consider location/radiation/quality/duration/timing/severity/associated sxs/prior treatment) HPI Comments: The patient is an otherwise healthy 26 year old female who tonight, while at a drug store, checked her blood pressure and found it to be elevated and her heart rate to be fast.  She could perceive a "racing heartbeat" but denies any other symptoms.  She has presented for evaluation.  She does not have a history of established hypertension.  Patient is a 26 y.o. female presenting with hypertension. The history is provided by the patient.  Hypertension This is a new problem. The problem occurs constantly. The problem has not changed since onset.Pertinent negatives include no chest pain, no abdominal pain, no headaches and no shortness of breath. The symptoms are aggravated by nothing. The symptoms are relieved by nothing. She has tried nothing for the symptoms.    Past Medical History  Diagnosis Date  . GERD (gastroesophageal reflux disease)     Past Surgical History  Procedure Date  . Cesarean section     No family history on file.  History  Substance Use Topics  . Smoking status: Never Smoker   . Smokeless tobacco: Not on file  . Alcohol Use: Yes     occasional    OB History    Grav Para Term Preterm Abortions TAB SAB Ect Mult Living                  Review of Systems  Constitutional: Negative for diaphoresis, activity change, fatigue and unexpected weight change.  HENT: Negative for hearing loss, nosebleeds and tinnitus.   Eyes: Negative for visual disturbance.  Respiratory: Negative for cough, chest tightness, shortness of breath and wheezing.   Cardiovascular: Negative for chest pain, palpitations and leg swelling.  Gastrointestinal: Negative.  Negative for abdominal pain.    Genitourinary: Negative for hematuria.  Musculoskeletal: Negative.   Skin: Negative.   Neurological: Negative for dizziness, tremors, seizures, syncope, facial asymmetry, speech difficulty, weakness, light-headedness, numbness and headaches.  Hematological: Does not bruise/bleed easily.  Psychiatric/Behavioral: Negative.     Allergies  Review of patient's allergies indicates no known allergies.  Home Medications   Current Outpatient Rx  Name Route Sig Dispense Refill  . METOPROLOL TARTRATE 25 MG PO TABS Oral Take 1 tablet (25 mg total) by mouth 2 (two) times daily. 60 tablet 0    BP 115/66  Pulse 109  Temp(Src) 98.3 F (36.8 C) (Oral)  Resp 27  SpO2 97%  Physical Exam  Nursing note and vitals reviewed. Constitutional: She is oriented to person, place, and time. She appears well-developed and well-nourished. No distress.  HENT:  Head: Normocephalic and atraumatic.  Right Ear: External ear normal.  Left Ear: External ear normal.  Nose: Nose normal.  Mouth/Throat: Oropharynx is clear and moist.  Eyes: Conjunctivae, EOM and lids are normal. Pupils are equal, round, and reactive to light.  Fundoscopic exam:      The right eye shows no exudate, no hemorrhage and no papilledema.       The left eye shows no exudate, no hemorrhage and no papilledema.  Neck: Normal range of motion. Neck supple. No JVD present. No tracheal deviation present.  Cardiovascular: Regular rhythm, S1 normal, S2 normal, normal heart sounds and intact distal pulses.  Tachycardia present.  Exam  reveals no gallop and no friction rub.   No murmur heard. Pulmonary/Chest: Effort normal and breath sounds normal. No stridor. No respiratory distress. She has no wheezes. She has no rales.  Abdominal: Soft. Bowel sounds are normal. She exhibits no distension. There is no tenderness.  Musculoskeletal: Normal range of motion. She exhibits no edema and no tenderness.  Neurological: She is alert and oriented to person,  place, and time. She has normal reflexes. She displays no tremor. No cranial nerve deficit or sensory deficit. She exhibits normal muscle tone. Coordination and gait normal. GCS eye subscore is 4. GCS verbal subscore is 5. GCS motor subscore is 6.  Skin: Skin is warm and dry. No rash noted. She is not diaphoretic. No erythema. No pallor.  Psychiatric: She has a normal mood and affect. Her behavior is normal. Judgment and thought content normal.    ED Course  Procedures (including critical care time)   Date: 02/02/2012  Rate: 132  Rhythm: sinus tachycardia  QRS Axis: normal  Intervals: normal  ST/T Wave abnormalities: normal  Conduction Disutrbances: none  Narrative Interpretation: unremarkable      Labs Reviewed  DIFFERENTIAL - Abnormal; Notable for the following:    Eosinophils Relative 6 (*)    All other components within normal limits  BASIC METABOLIC PANEL - Abnormal; Notable for the following:    Glucose, Bld 103 (*)    Creatinine, Ser 0.44 (*)    All other components within normal limits  URINALYSIS, ROUTINE W REFLEX MICROSCOPIC - Abnormal; Notable for the following:    Ketones, ur 15 (*)    All other components within normal limits  TSH - Abnormal; Notable for the following:    TSH <0.008 (*)    All other components within normal limits  CBC  POCT PREGNANCY, URINE  D-DIMER, QUANTITATIVE  LAB REPORT - SCANNED   No results found.   1. Sinus tachycardia   2. Hypertension       MDM  The patient presents with moderate hypertension and tachycardia in a sinus rhythm without any conduction abnormalities.  She is not anemic, bears no signs of infection, has normal electrolytes, does not appear to have pulmonary embolism by negative d-dimer in a low risk patient without symptoms of same.  My suspicion is that she may be hyperthyroid and I have sent a TSH level to assess this, but as it is not going to be resulted at the time of encounter, it will have to be followed up  by her primary care provider early this upcoming week.  I have conveyed this to the patient and she states her understanding of and agreement with this plan of care.  In the meantime, I will treat her with metoprolol.  I have given her strict return precautions regarding symptoms that should she develop, would warrant immediate return for further evaluation.        Felisa Bonier, MD 02/02/12 [phone]

## 2012-05-04 ENCOUNTER — Emergency Department (HOSPITAL_COMMUNITY)
Admission: EM | Admit: 2012-05-04 | Discharge: 2012-05-04 | Disposition: A | Discharge status: Home / Self Care | Payer: Self-pay | Attending: Emergency Medicine | Admitting: Emergency Medicine

## 2012-05-04 ENCOUNTER — Emergency Department (HOSPITAL_COMMUNITY): Payer: Self-pay

## 2012-05-04 ENCOUNTER — Encounter (HOSPITAL_COMMUNITY): Payer: Self-pay

## 2012-05-04 DIAGNOSIS — F419 Anxiety disorder, unspecified: Secondary | ICD-10-CM

## 2012-05-04 DIAGNOSIS — R002 Palpitations: Secondary | ICD-10-CM | POA: Insufficient documentation

## 2012-05-04 DIAGNOSIS — K219 Gastro-esophageal reflux disease without esophagitis: Secondary | ICD-10-CM | POA: Insufficient documentation

## 2012-05-04 DIAGNOSIS — F411 Generalized anxiety disorder: Secondary | ICD-10-CM | POA: Insufficient documentation

## 2012-05-04 DIAGNOSIS — I10 Essential (primary) hypertension: Secondary | ICD-10-CM | POA: Insufficient documentation

## 2012-05-04 HISTORY — DX: Essential (primary) hypertension: I10

## 2012-05-04 LAB — CBC
Hemoglobin: 13 g/dL (ref 12.0–15.0)
MCH: 27.9 pg (ref 26.0–34.0)
MCV: 79.8 fL (ref 78.0–100.0)
RBC: 4.66 MIL/uL (ref 3.87–5.11)

## 2012-05-04 LAB — POCT I-STAT, CHEM 8
BUN: 10 mg/dL (ref 6–23)
Calcium, Ion: 1.31 mmol/L (ref 1.12–1.32)
Chloride: 104 mEq/L (ref 96–112)
Creatinine, Ser: 0.5 mg/dL (ref 0.50–1.10)
Glucose, Bld: 105 mg/dL — ABNORMAL HIGH (ref 70–99)

## 2012-05-04 LAB — DIFFERENTIAL
Eosinophils Absolute: 0.4 10*3/uL (ref 0.0–0.7)
Eosinophils Relative: 5 % (ref 0–5)
Lymphs Abs: 2.8 10*3/uL (ref 0.7–4.0)
Monocytes Absolute: 0.6 10*3/uL (ref 0.1–1.0)
Monocytes Relative: 8 % (ref 3–12)

## 2012-05-04 LAB — POCT I-STAT TROPONIN I: Troponin i, poc: 0 ng/mL (ref 0.00–0.08)

## 2012-05-04 MED ORDER — SODIUM CHLORIDE 0.9 % IV BOLUS (SEPSIS)
1000.0000 mL | Freq: Once | INTRAVENOUS | Status: AC
  Administered 2012-05-04: 1000 mL via INTRAVENOUS

## 2012-05-04 NOTE — ED Notes (Addendum)
Pt was watching TV with husband when her heart started to race at around midnight. Pt has Hx of Palpitations with eclampsia 2 yrs ago. Last time resolved with lopressor in hospital. Reports increased stress from family dynamics regarding custody of son. No N/V or SOB. EMS reports ST 120 bpm.

## 2012-05-04 NOTE — ED Provider Notes (Signed)
History     CSN: [id number]  Arrival date & time 05/04/12  4098   First MD Initiated Contact with Patient 05/04/12 0056      Chief Complaint  Patient presents with  . Palpitations    (Consider location/radiation/quality/duration/timing/severity/associated sxs/prior treatment) Patient is a 26 y.o. female presenting with palpitations. The history is provided by the patient. No language interpreter was used.  Palpitations  This is a recurrent problem. The current episode started 6 to 12 hours ago. The problem occurs constantly. The problem has not changed since onset.The problem is associated with stress. Pertinent negatives include no diaphoresis, no fever, no malaise/fatigue, no numbness, no chest pain, no chest pressure, no claudication, no exertional chest pressure, no irregular heartbeat, no near-syncope, no orthopnea, no PND, no syncope, no abdominal pain, no nausea, no vomiting, no headaches, no back pain, no leg pain, no lower extremity edema, no dizziness, no weakness, no cough, no hemoptysis, no shortness of breath and no sputum production. She has tried nothing for the symptoms. The treatment provided no relief. Risk factors include no known risk factors. Her past medical history does not include heart disease or hyperthyroidism.    Past Medical History  Diagnosis Date  . GERD (gastroesophageal reflux disease)   . Hypertension     Past Surgical History  Procedure Date  . Cesarean section     No family history on file.  History  Substance Use Topics  . Smoking status: Never Smoker   . Smokeless tobacco: Not on file  . Alcohol Use: Yes     occasional    OB History    Grav Para Term Preterm Abortions TAB SAB Ect Mult Living                  Review of Systems  Constitutional: Negative for fever, malaise/fatigue and diaphoresis.  Respiratory: Negative for cough, hemoptysis, sputum production and shortness of breath.   Cardiovascular: Positive for palpitations.  Negative for chest pain, orthopnea, claudication, leg swelling, syncope, PND and near-syncope.  Gastrointestinal: Negative for nausea, vomiting and abdominal pain.  Musculoskeletal: Negative for back pain.  Neurological: Negative for dizziness, weakness, numbness and headaches.  Psychiatric/Behavioral: The patient is nervous/anxious.   All other systems reviewed and are negative.    Allergies  Review of patient's allergies indicates no known allergies.  Home Medications  No current outpatient prescriptions on file.  BP 133/91  Pulse 100  Temp 98 F (36.7 C) (Oral)  Resp 16  SpO2 2%  Physical Exam  Constitutional: She is oriented to person, place, and time. She appears well-developed and well-nourished.  HENT:  Head: Normocephalic and atraumatic.  Mouth/Throat: Oropharynx is clear and moist.  Eyes: Conjunctivae are normal. Pupils are equal, round, and reactive to light.  Neck: Normal range of motion. Neck supple.  Cardiovascular: Normal rate, regular rhythm and intact distal pulses.   Pulmonary/Chest: Effort normal and breath sounds normal. She has no wheezes. She has no rales.  Abdominal: Soft. Bowel sounds are normal. There is no tenderness. There is no rebound and no guarding.  Musculoskeletal: Normal range of motion. She exhibits no edema and no tenderness.  Neurological: She is alert and oriented to person, place, and time. She has normal reflexes.  Skin: Skin is warm and dry.  Psychiatric: Thought content normal.    ED Course  Procedures (including critical care time)  Labs Reviewed  POCT I-STAT, CHEM 8 - Abnormal; Notable for the following:    Potassium 3.4 (*)  Glucose, Bld 105 (*)     All other components within normal limits  CBC  DIFFERENTIAL  D-DIMER, QUANTITATIVE  POCT PREGNANCY, URINE  POCT I-STAT TROPONIN I   Dg Chest 2 View  05/04/2012  *RADIOLOGY REPORT*  Clinical Data: 26 year old female shortness of breath, chest pain, high blood pressure.   CHEST - 2 VIEW  Comparison: 02/26/2011.  Findings: Normal lung volumes. Normal cardiac size and mediastinal contours.  Visualized tracheal air column is within normal limits. The lungs are clear.  No pneumothorax or effusion.  No osseous abnormality identified.  IMPRESSION: Negative, no acute cardiopulmonary abnormality.  Original Report Authenticated By: Harley Hallmark, M.D.     No diagnosis found.    MDM   Date: 05/04/2012  Rate: 115  Rhythm: sinus tachycardia  QRS Axis: normal  Intervals: PR prolonged  ST/T Wave abnormalities: normal  Conduction Disutrbances:none  Narrative Interpretation:   Old EKG Reviewed: none available   Suspect anxiety related to the fact that son was not returned on time for unsupervised visit with father.  Negative DDimer and cardiac enzyme.  Will refer back to family doctor for ongoing care.  Return immediately for chest pain shortness of breath irregular heart beat or any concerns.  Patient verbalizes understanding and agrees to follow up     Copelyn Widmer Smitty Cords, MD 05/04/12 [phone]

## 2012-05-27 ENCOUNTER — Emergency Department (HOSPITAL_COMMUNITY)
Admission: EM | Admit: 2012-05-27 | Discharge: 2012-05-27 | Disposition: A | Discharge status: Home / Self Care | Payer: Self-pay | Attending: Emergency Medicine | Admitting: Emergency Medicine

## 2012-05-27 ENCOUNTER — Encounter (HOSPITAL_COMMUNITY): Payer: Self-pay | Admitting: *Deleted

## 2012-05-27 ENCOUNTER — Emergency Department (HOSPITAL_COMMUNITY): Payer: Self-pay

## 2012-05-27 DIAGNOSIS — S6720XA Crushing injury of unspecified hand, initial encounter: Secondary | ICD-10-CM | POA: Insufficient documentation

## 2012-05-27 DIAGNOSIS — I10 Essential (primary) hypertension: Secondary | ICD-10-CM | POA: Insufficient documentation

## 2012-05-27 DIAGNOSIS — K219 Gastro-esophageal reflux disease without esophagitis: Secondary | ICD-10-CM | POA: Insufficient documentation

## 2012-05-27 DIAGNOSIS — W230XXA Caught, crushed, jammed, or pinched between moving objects, initial encounter: Secondary | ICD-10-CM | POA: Insufficient documentation

## 2012-05-27 MED ORDER — HYDROCODONE-ACETAMINOPHEN 5-325 MG PO TABS: 2.0000 | ORAL_TABLET | ORAL | Status: AC | PRN

## 2012-05-27 NOTE — Progress Notes (Signed)
Orthopedic Tech Progress Note Patient Details:  Christina Payne [birth year] [id number]  Ortho Devices Type of Ortho Device: Velcro wrist splint Ortho Device/Splint Location: right UE.applied by J.Cammer ortho tech. Ortho Device/Splint Interventions: Application   Christina Payne 05/27/2012, 12:16 PM

## 2012-05-27 NOTE — ED Notes (Signed)
Patient states she slammed her right hand in the door 4 days ago.  She continues to have pain in her hand and she cannot use her hand due to pain.  She has hx of fx in same hand

## 2012-05-27 NOTE — ED Provider Notes (Signed)
History   This chart was scribed for Christina Shi, MD by Christina Payne. The patient was seen in room TR05C/TR05C and the patient's care was started at 12:04 PM     CSN: [id number]  Arrival date & time 05/27/12  1114   First MD Initiated Contact with Patient 05/27/12 1139      Chief Complaint  Patient presents with  . Hand Pain    (Consider location/radiation/quality/duration/timing/severity/associated sxs/prior treatment) Patient is a 26 y.o. female presenting with hand pain. The history is provided by the patient. No language interpreter was used.  Hand Pain This is a new problem. The current episode started more than 2 days ago. The problem occurs constantly. The problem has not changed since onset.Pertinent negatives include no chest pain and no headaches. The symptoms are aggravated by bending. Nothing relieves the symptoms. She has tried nothing for the symptoms. The treatment provided no relief.    Christina Payne is a 26 y.o. female who presents to the Emergency Department complaining of moderate, episodic hand pain located at the right hand onset four days ago. The pt informs the EDP that she is hearing a clicking noise in her right hand. Modifying factors include bearing weight on the right hand which intensifies the pain. 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  PCP is Dr. Concepcion Elk.    Past Medical History  Diagnosis Date  . GERD (gastroesophageal reflux disease)   . Hypertension     Past Surgical History  Procedure Date  . Cesarean section     No family history on file.  History  Substance Use Topics  . Smoking status: Never Smoker   . Smokeless tobacco: Not on file  . Alcohol Use: Yes     occasional    OB History    Grav Para Term Preterm Abortions TAB SAB Ect Mult Living                  Review of Systems  Cardiovascular: Negative for chest pain.  Neurological: Negative for headaches.  All  other systems reviewed and are negative.   10 Systems reviewed and all are negative for acute change except as noted in the HPI.   Allergies  Review of patient's allergies indicates no known allergies.  Home Medications   Current Outpatient Rx  Name Route Sig Dispense Refill  . ADULT MULTIVITAMIN W/MINERALS CH Oral Take 1 tablet by mouth daily.    Marland Kitchen HYDROCODONE-ACETAMINOPHEN 5-325 MG PO TABS Oral Take 2 tablets by mouth every 4 (four) hours as needed for pain. 10 tablet 0    BP 134/79  Pulse 115  Temp 98.5 F (36.9 C) (Oral)  Resp 20  SpO2 99%  Physical Exam  Nursing note and vitals reviewed. Constitutional: She is oriented to person, place, and time. She appears well-developed and well-nourished. No distress.  HENT:  Head: Normocephalic and atraumatic.  Nose: Nose normal.  Eyes: Pupils are equal, round, and reactive to light.  Neck: Normal range of motion.  Cardiovascular: Normal rate and intact distal pulses.   Pulmonary/Chest: No respiratory distress.  Abdominal: Normal appearance. She exhibits no distension.  Musculoskeletal:       Right hand: She exhibits decreased range of motion, tenderness and swelling. She exhibits normal capillary refill and no deformity.       Hands: Neurological: She is alert and oriented to person, place, and time. No cranial nerve deficit.  Skin: Skin is  warm and dry. No rash noted.  Psychiatric: She has a normal mood and affect. Her behavior is normal.    ED Course  Procedures (including critical care time)  DIAGNOSTIC STUDIES: Oxygen Saturation is 99% on room air, normal by my interpretation.    COORDINATION OF CARE:    12:05PM- EDP at bedside discusses treatment plan concerning application of splint and pain management, EDP reviews x-ray with patient.   Labs Reviewed - No data to display  Dg Hand Complete Right  05/27/2012  *RADIOLOGY REPORT*  Clinical Data: 26 year old female with blunt trauma.  Pain.  The patient reports a  fracture 4 years ago.  RIGHT HAND - COMPLETE 3+ VIEW  Comparison: None.  Findings: Bone mineralization is within normal limits.  Distal radius and ulna appear intact.  Carpal bone alignment within normal limits.  Joint spaces preserved.  No fracture dislocation identified.  IMPRESSION: No acute fracture or dislocation identified about the right hand.  Original Report Authenticated By: Harley Hallmark, M.D.      1. Crush injury of hand       MDM         I personally performed the services described in this documentation, which was scribed in my presence. The recorded information has been reviewed and considered.    Christina Shi, MD 05/27/12 [phone]

## 2012-06-13 ENCOUNTER — Emergency Department (HOSPITAL_COMMUNITY)
Admission: EM | Admit: 2012-06-13 | Discharge: 2012-06-13 | Disposition: A | Discharge status: Home / Self Care | Payer: Self-pay | Attending: Emergency Medicine | Admitting: Emergency Medicine

## 2012-06-13 ENCOUNTER — Encounter (HOSPITAL_COMMUNITY): Payer: Self-pay | Admitting: *Deleted

## 2012-06-13 DIAGNOSIS — H109 Unspecified conjunctivitis: Secondary | ICD-10-CM

## 2012-06-13 DIAGNOSIS — J329 Chronic sinusitis, unspecified: Secondary | ICD-10-CM

## 2012-06-13 MED ORDER — TOBRAMYCIN 0.3 % OP SOLN
2.0000 [drp] | Freq: Four times a day (QID) | OPHTHALMIC | Status: DC
  Administered 2012-06-13: 2 [drp] via OPHTHALMIC
  Filled 2012-06-13: qty 5

## 2012-06-13 MED ORDER — AZITHROMYCIN 250 MG PO TABS: 250.0000 mg | ORAL_TABLET | Freq: Every day | ORAL | Status: AC

## 2012-06-13 NOTE — ED Provider Notes (Signed)
Medical screening examination/treatment/procedure(s) were performed by non-physician practitioner and as supervising physician I was immediately available for consultation/collaboration.    Vida Roller, MD 06/13/12 1535

## 2012-06-13 NOTE — ED Notes (Signed)
Pt states she began experiencing a sore throat and sinus pain Mon.  Yesterday she began to cough and friends noticed that her L eye was pink.  Pt states she awoke about 1 hour prior to see her R eye swollen shut. L conjunctiva red.

## 2012-06-13 NOTE — ED Provider Notes (Signed)
History     CSN: [id number]  Arrival date & time 06/13/12  0501   First MD Initiated Contact with Patient 06/13/12 [phone]      Chief Complaint  Patient presents with  . Conjunctivitis  . URI    (Consider location/radiation/quality/duration/timing/severity/associated sxs/prior treatment) Patient is a 26 y.o. female presenting with conjunctivitis and URI. The history is provided by the patient.  Conjunctivitis  The current episode started yesterday. Associated symptoms include a fever, eye itching, congestion, sore throat, cough, URI, eye discharge and eye redness. Pertinent negatives include no nausea. Associated symptoms comments: She has had sinus pressure, congestion, dry cough, aches and subjective fever for 2-3 days and onset of bilateral eye irritation and drainage this morning that caused matting. No one else at home is ill. Marland Kitchen  URI The primary symptoms include fever, sore throat and cough. Primary symptoms do not include nausea.  The sore throat is not accompanied by trouble swallowing.  Symptoms associated with the illness include chills, sinus pressure and congestion.    Past Medical History  Diagnosis Date  . GERD (gastroesophageal reflux disease)   . Hypertension   . Sinus tachycardia     Past Surgical History  Procedure Date  . Cesarean section     No family history on file.  History  Substance Use Topics  . Smoking status: Never Smoker   . Smokeless tobacco: Not on file  . Alcohol Use: Yes     occasional    OB History    Grav Para Term Preterm Abortions TAB SAB Ect Mult Living                  Review of Systems  Constitutional: Positive for fever and chills.  HENT: Positive for congestion, sore throat and sinus pressure. Negative for trouble swallowing.   Eyes: Positive for discharge, redness and itching.  Respiratory: Positive for cough. Negative for shortness of breath.   Cardiovascular: Negative for chest pain.  Gastrointestinal: Negative for  nausea.    Allergies  Review of patient's allergies indicates no known allergies.  Home Medications   Current Outpatient Rx  Name Route Sig Dispense Refill  . ADULT MULTIVITAMIN W/MINERALS CH Oral Take 1 tablet by mouth daily.      BP 117/77  Pulse 109  Temp 98.4 F (36.9 C) (Oral)  Resp 16  SpO2 99%  Physical Exam  Constitutional: She appears well-developed and well-nourished.  HENT:  Head: Normocephalic.  Right Ear: External ear normal.  Left Ear: External ear normal.  Nose: Mucosal edema present. Right sinus exhibits frontal sinus tenderness. Left sinus exhibits frontal sinus tenderness.  Mouth/Throat: Oropharynx is clear and moist.  Eyes: EOM are normal. Pupils are equal, round, and reactive to light.       Bilateral eye redness. Mildly swollen conjunctiva. No purulent drainage.   Neck: Normal range of motion. Neck supple.  Cardiovascular: Normal rate and normal heart sounds.   No murmur heard. Pulmonary/Chest: Effort normal and breath sounds normal. She has no wheezes. She has no rales.  Abdominal: Soft. Bowel sounds are normal. She exhibits no distension. There is no tenderness.  Musculoskeletal: Normal range of motion.  Lymphadenopathy:    She has no cervical adenopathy.  Skin: Skin is warm and dry. No pallor.    ED Course  Procedures (including critical care time)  Labs Reviewed - No data to display No results found.   No diagnosis found. 1. Sinusitis 2. Conjunctivitis    MDM  Sxs c/w  sinusitis with conjunctivitis. Will give abx., suggest symptomatic treatment.        Rodena Medin, PA-C 06/13/12 [phone]

## 2012-07-07 ENCOUNTER — Encounter (HOSPITAL_COMMUNITY): Payer: Self-pay | Admitting: Cardiology

## 2012-07-07 ENCOUNTER — Emergency Department (HOSPITAL_COMMUNITY)
Admission: EM | Admit: 2012-07-07 | Discharge: 2012-07-07 | Disposition: A | Discharge status: Home / Self Care | Payer: Self-pay | Attending: Emergency Medicine | Admitting: Emergency Medicine

## 2012-07-07 DIAGNOSIS — X58XXXA Exposure to other specified factors, initial encounter: Secondary | ICD-10-CM | POA: Insufficient documentation

## 2012-07-07 DIAGNOSIS — K219 Gastro-esophageal reflux disease without esophagitis: Secondary | ICD-10-CM | POA: Insufficient documentation

## 2012-07-07 DIAGNOSIS — S335XXA Sprain of ligaments of lumbar spine, initial encounter: Secondary | ICD-10-CM | POA: Insufficient documentation

## 2012-07-07 DIAGNOSIS — I10 Essential (primary) hypertension: Secondary | ICD-10-CM | POA: Insufficient documentation

## 2012-07-07 DIAGNOSIS — S39012A Strain of muscle, fascia and tendon of lower back, initial encounter: Secondary | ICD-10-CM

## 2012-07-07 DIAGNOSIS — R51 Headache: Secondary | ICD-10-CM | POA: Insufficient documentation

## 2012-07-07 LAB — BASIC METABOLIC PANEL
BUN: 8 mg/dL (ref 6–23)
Chloride: 101 mEq/L (ref 96–112)
Creatinine, Ser: 0.56 mg/dL (ref 0.50–1.10)
GFR calc Af Amer: >90 mL/min (ref >90)
GFR calc non Af Amer: >90 mL/min (ref >90)
Sodium: 134 mEq/L — ABNORMAL LOW (ref 135–145)

## 2012-07-07 LAB — URINALYSIS, ROUTINE W REFLEX MICROSCOPIC
Bilirubin Urine: NEGATIVE
Ketones, ur: NEGATIVE mg/dL
Leukocytes, UA: NEGATIVE
Nitrite: NEGATIVE
Specific Gravity, Urine: 1.01 (ref 1.005–1.030)
Urobilinogen, UA: 0.2 mg/dL (ref 0.0–1.0)
pH: 6.5 (ref 5.0–8.0)

## 2012-07-07 LAB — CBC
Hemoglobin: 13.2 g/dL (ref 12.0–15.0)
MCH: 28.3 pg (ref 26.0–34.0)
MCV: 81.3 fL (ref 78.0–100.0)
RBC: 4.66 MIL/uL (ref 3.87–5.11)

## 2012-07-07 MED ORDER — IBUPROFEN 800 MG PO TABS: 800.0000 mg | ORAL_TABLET | Freq: Three times a day (TID) | ORAL | Status: AC

## 2012-07-07 MED ORDER — DIPHENHYDRAMINE HCL 50 MG/ML IJ SOLN
12.5000 mg | Freq: Once | INTRAMUSCULAR | Status: AC
  Administered 2012-07-07: 12.5 mg via INTRAVENOUS
  Filled 2012-07-07: qty 1

## 2012-07-07 MED ORDER — HYDROCODONE-ACETAMINOPHEN 5-325 MG PO TABS: ORAL_TABLET | ORAL | Status: DC

## 2012-07-07 MED ORDER — METOCLOPRAMIDE HCL 5 MG/ML IJ SOLN
10.0000 mg | Freq: Once | INTRAMUSCULAR | Status: AC
  Administered 2012-07-07: 10 mg via INTRAVENOUS
  Filled 2012-07-07: qty 2

## 2012-07-07 MED ORDER — SODIUM CHLORIDE 0.9 % IV SOLN
1000.0000 mL | Freq: Once | INTRAVENOUS | Status: AC
  Administered 2012-07-07: 1000 mL via INTRAVENOUS

## 2012-07-07 MED ORDER — KETOROLAC TROMETHAMINE 30 MG/ML IJ SOLN
30.0000 mg | Freq: Once | INTRAMUSCULAR | Status: AC
  Administered 2012-07-07: 30 mg via INTRAVENOUS
  Filled 2012-07-07: qty 1

## 2012-07-07 MED ORDER — CYCLOBENZAPRINE HCL 5 MG PO TABS: 10.0000 mg | ORAL_TABLET | Freq: Three times a day (TID) | ORAL | Status: AC | PRN

## 2012-07-07 NOTE — ED Notes (Signed)
Pt reports that she started having back pain last night and felt a squeezing sensation in the lower back. Reports having dizziness related to the headache.

## 2012-07-10 NOTE — ED Provider Notes (Signed)
History     CSN: [id number]  Arrival date & time 07/07/12  4098   First MD Initiated Contact with Patient 07/07/12 [phone]      Chief Complaint  Patient presents with  . Headache  . Illegal value: [    Back Ache    (Consider location/radiation/quality/duration/timing/severity/associated sxs/prior treatment) HPI Patient is a 59 roll female who presents today complaining of 10 out of 10 bilateral frontal headache with no associated photophobia, numbness, tingling, weakness, phonophobia, fevers, rash, or vomiting. Patient does endorse some nausea. She also complains of some bilateral lower back pain that she noted last night. She cannot recall a specific injury that might have precipitated this. She denies any urinary symptoms including increased urinary frequency, dysuria, or hematuria. She denies any constipation or diarrhea. Patient has had headaches previously for reports that this one feels different. Patient reports lightheadedness associated with the headache. Headache was noted upon waking this morning and back pain was present last night. There's no radiation of the back pain is described as a 5/10 with a dull ache. There no other associated or modifying factors. Past Medical History  Diagnosis Date  . GERD (gastroesophageal reflux disease)   . Hypertension   . Sinus tachycardia     Past Surgical History  Procedure Date  . Cesarean section     History reviewed. No pertinent family history.  History  Substance Use Topics  . Smoking status: Never Smoker   . Smokeless tobacco: Not on file  . Alcohol Use: No     occasional    OB History    Grav Para Term Preterm Abortions TAB SAB Ect Mult Living                  Review of Systems  Eyes: Negative.   Respiratory: Negative.   Cardiovascular: Negative.   Gastrointestinal: Positive for nausea.  Genitourinary: Negative.   Musculoskeletal: Positive for back pain.  Skin: Negative.   Neurological: Positive for headaches.    Hematological: Negative.   Psychiatric/Behavioral: Negative.   All other systems reviewed and are negative.    Allergies  Review of patient's allergies indicates no known allergies.  Home Medications   Current Outpatient Rx  Name Route Sig Dispense Refill  . LORATADINE 10 MG PO TABS Oral Take 10 mg by mouth daily.    . ADULT MULTIVITAMIN W/MINERALS CH Oral Take 1 tablet by mouth daily.    . CYCLOBENZAPRINE HCL 5 MG PO TABS Oral Take 2 tablets (10 mg total) by mouth 3 (three) times daily as needed for muscle spasms. 30 tablet 0  . HYDROCODONE-ACETAMINOPHEN 5-325 MG PO TABS  Take 1-2 tabs by mouth every 6 hours when necessary pain. 30 tablet 0  . IBUPROFEN 800 MG PO TABS Oral Take 1 tablet (800 mg total) by mouth 3 (three) times daily. 21 tablet 0    BP 127/75  Pulse 110  Temp 100 F (37.8 C) (Oral)  Resp 18  SpO2 100%  Physical Exam  Nursing note and vitals reviewed. GEN: Well-developed, well-nourished female in no distress HEENT: Atraumatic, normocephalic. Oropharynx clear without erythema EYES: PERRLA BL, no scleral icterus. NECK: Trachea midline, no meningismus CV: regular rate and rhythm. No murmurs, rubs, or gallops PULM: No respiratory distress.  No crackles, wheezes, or rales. GI: soft, non-tender. No guarding, rebound, or tenderness. + bowel sounds  GU: deferred Neuro: cranial nerves grossly 2-12 intact, no abnormalities of strength or sensation, A and O x 3, no pronator drift, no  dysmetria on finger to nose test bilaterally. MSK: Patient moves all 4 extremities symmetrically, no deformity, edema, or injury noted Skin: No rashes petechiae, purpura, or jaundice Psych: no abnormality of mood   ED Course  Procedures (including critical care time)  Labs Reviewed  CBC - Abnormal; Notable for the following:    WBC 12.4 (*)     All other components within normal limits  BASIC METABOLIC PANEL - Abnormal; Notable for the following:    Sodium 134 (*)     Glucose,  Bld 113 (*)     All other components within normal limits  URINALYSIS, ROUTINE W REFLEX MICROSCOPIC  POCT PREGNANCY, URINE  LAB REPORT - SCANNED   No results found.   1. Headache   2. Low back strain       MDM  Patient was evaluated by myself. Based on presentation patient seemed most likely to have musculoskeletal back pain with associated migraine headache. Patient was treated for her headache with Reglan and Benadryl as well as IV fluids a significant improvement. Patient has history of sinus tachycardia and presented in tachycardia today. She was not hypotensive or febrile. CBC, BMP, urinalysis, and urine pregnancy were unremarkable. Patient was feeling much better after initial medications and was given a dose of Toradol with resolution of her symptoms. As patient did not have any urinary pathology pain was thought to be musculoskeletal. She was discharged with prescriptions for Flexeril, Vicodin, and ibuprofen. She can followup with her primary care provider as needed.        Cyndra Numbers, MD 07/11/12 [phone]

## 2012-11-30 ENCOUNTER — Emergency Department (HOSPITAL_COMMUNITY)
Admission: EM | Admit: 2012-11-30 | Discharge: 2012-12-01 | Disposition: A | Discharge status: Home / Self Care | Payer: Self-pay | Attending: Emergency Medicine | Admitting: Emergency Medicine

## 2012-11-30 ENCOUNTER — Encounter (HOSPITAL_COMMUNITY): Payer: Self-pay | Admitting: *Deleted

## 2012-11-30 DIAGNOSIS — K219 Gastro-esophageal reflux disease without esophagitis: Secondary | ICD-10-CM | POA: Insufficient documentation

## 2012-11-30 DIAGNOSIS — I498 Other specified cardiac arrhythmias: Secondary | ICD-10-CM | POA: Insufficient documentation

## 2012-11-30 DIAGNOSIS — R0981 Nasal congestion: Secondary | ICD-10-CM

## 2012-11-30 DIAGNOSIS — Z79899 Other long term (current) drug therapy: Secondary | ICD-10-CM | POA: Insufficient documentation

## 2012-11-30 DIAGNOSIS — R5381 Other malaise: Secondary | ICD-10-CM | POA: Insufficient documentation

## 2012-11-30 DIAGNOSIS — J3489 Other specified disorders of nose and nasal sinuses: Secondary | ICD-10-CM | POA: Insufficient documentation

## 2012-11-30 DIAGNOSIS — I10 Essential (primary) hypertension: Secondary | ICD-10-CM | POA: Insufficient documentation

## 2012-11-30 DIAGNOSIS — Z791 Long term (current) use of non-steroidal anti-inflammatories (NSAID): Secondary | ICD-10-CM | POA: Insufficient documentation

## 2012-11-30 DIAGNOSIS — R5383 Other fatigue: Secondary | ICD-10-CM | POA: Insufficient documentation

## 2012-11-30 DIAGNOSIS — R42 Dizziness and giddiness: Secondary | ICD-10-CM

## 2012-11-30 MED ORDER — KETOROLAC TROMETHAMINE 60 MG/2ML IM SOLN
60.0000 mg | Freq: Once | INTRAMUSCULAR | Status: AC
  Administered 2012-11-30: 60 mg via INTRAMUSCULAR
  Filled 2012-11-30: qty 2

## 2012-11-30 NOTE — ED Notes (Signed)
She is supposed to take bp meds and she has not  Taken for months.  She took one of her bp pills tuesday

## 2012-11-30 NOTE — ED Provider Notes (Signed)
History     CSN: [id number]  Arrival date & time 11/30/12  2006   None     Chief Complaint  Patient presents with  . Headache   HPI  History provided by the patient. Patient is a 27 year old female with history of hypertension who presents with complaints of lightheadedness and headache. Patient states that she first developed a headache while work one week ago. She reports a headache gradually increase it was very severe migraine-type headache. She was taking several over-the-counter type medications for symptoms and began to have good relief. Symptoms at that time were associated with some nausea and lightheadedness symptoms. Although headache had improved significantly her lightheadedness symptoms have continued. They have been worse while standing for long periods or when she first gets up. Patient also reports having some symptoms of rhinorrhea and congestion with slight sinus pressure. She denies having any associated fever, chills or sweats. Denies any continued nausea or vomiting. Denies any weakness or numbness of extremities.    Past Medical History  Diagnosis Date  . GERD (gastroesophageal reflux disease)   . Hypertension   . Sinus tachycardia     Past Surgical History  Procedure Date  . Cesarean section     No family history on file.  History  Substance Use Topics  . Smoking status: Never Smoker   . Smokeless tobacco: Not on file  . Alcohol Use: No     Comment: occasional    OB History    Grav Para Term Preterm Abortions TAB SAB Ect Mult Living                  Review of Systems  Constitutional: Negative for fever and chills.  HENT: Positive for congestion.   Neurological: Positive for light-headedness and headaches.  All other systems reviewed and are negative.    Allergies  Review of patient's allergies indicates no known allergies.  Home Medications   Current Outpatient Rx  Name  Route  Sig  Dispense  Refill  . TYLENOL PO   Oral   Take 2  tablets by mouth every 4 (four) hours as needed. For pain         . ETONOGESTREL 68 MG [place] IMPL   Subcutaneous   Inject 1 each into the skin once.         . IBUPROFEN 800 MG PO TABS   Oral   Take 800 mg by mouth every 12 (twelve) hours as needed. For pain         . LORATADINE 10 MG PO TABS   Oral   Take 10 mg by mouth daily.         . ADULT MULTIVITAMIN W/MINERALS CH   Oral   Take 1 tablet by mouth daily.           BP 117/87  Pulse 108  Temp 99 F (37.2 C) (Oral)  Resp 18  SpO2 99%  Physical Exam  Nursing note and vitals reviewed. Constitutional: She is oriented to person, place, and time. She appears well-developed and well-nourished. No distress.  HENT:  Head: Normocephalic and atraumatic.  Right Ear: Tympanic membrane normal.  Left Ear: Tympanic membrane normal.  Nose: Mucosal edema present. No rhinorrhea.  Eyes: Conjunctivae normal and EOM are normal. Pupils are equal, round, and reactive to light.  Neck: Normal range of motion. Neck supple.       No meningeal signs  Cardiovascular: Normal rate and regular rhythm.   No murmur heard. Pulmonary/Chest:  Effort normal and breath sounds normal. No respiratory distress. She has no wheezes. She has no rales.  Abdominal: Soft. There is no tenderness. There is no rigidity, no rebound, no guarding, no CVA tenderness and no tenderness at McBurney's point.  Neurological: She is alert and oriented to person, place, and time. She has normal strength. No cranial nerve deficit or sensory deficit. Gait normal.  Skin: Skin is warm and dry. No rash noted.  Psychiatric: She has a normal mood and affect. Her behavior is normal.    ED Course  Procedures   Labs Reviewed  GLUCOSE, CAPILLARY - Abnormal; Notable for the following:    Glucose-Capillary 134 (*)     All other components within normal limits    1. Lightheaded   2. Sinus congestion       MDM  Patient seen and evaluated. Patient is well-appearing  appropriate for age. She is not appears nontoxic. No meningeal signs. Normal nonfocal neuro exam.   Patient feeling better after medications. She was also concerned for possible is of diabetes. She has some family history of diabetes. CBG was checked without significant finding.   Angus Seller, PA 12/01/12 [phone]

## 2012-11-30 NOTE — ED Notes (Signed)
Headache with dizziness since Saturday with sinus congestion and lt ear pain since then. Headache iws gone now.  She thinks she had a migraine headache and that went away 2 days ago.  The dizziness continues

## 2012-12-01 NOTE — ED Provider Notes (Signed)
Medical screening examination/treatment/procedure(s) were performed by non-physician practitioner and as supervising physician I was immediately available for consultation/collaboration.   Gwyneth Sprout, MD 12/01/12 [phone]

## 2013-01-09 IMAGING — CR DG HAND COMPLETE 3+V*R*
3 series · 3 of 3 positions shown · non-contrast
Comparison: None.

CLINICAL DATA: 26-year-old female with blunt trauma.  Pain.  The
patient reports a fracture 4 years ago.

RIGHT HAND - COMPLETE 3+ VIEW

[x hand pa right]
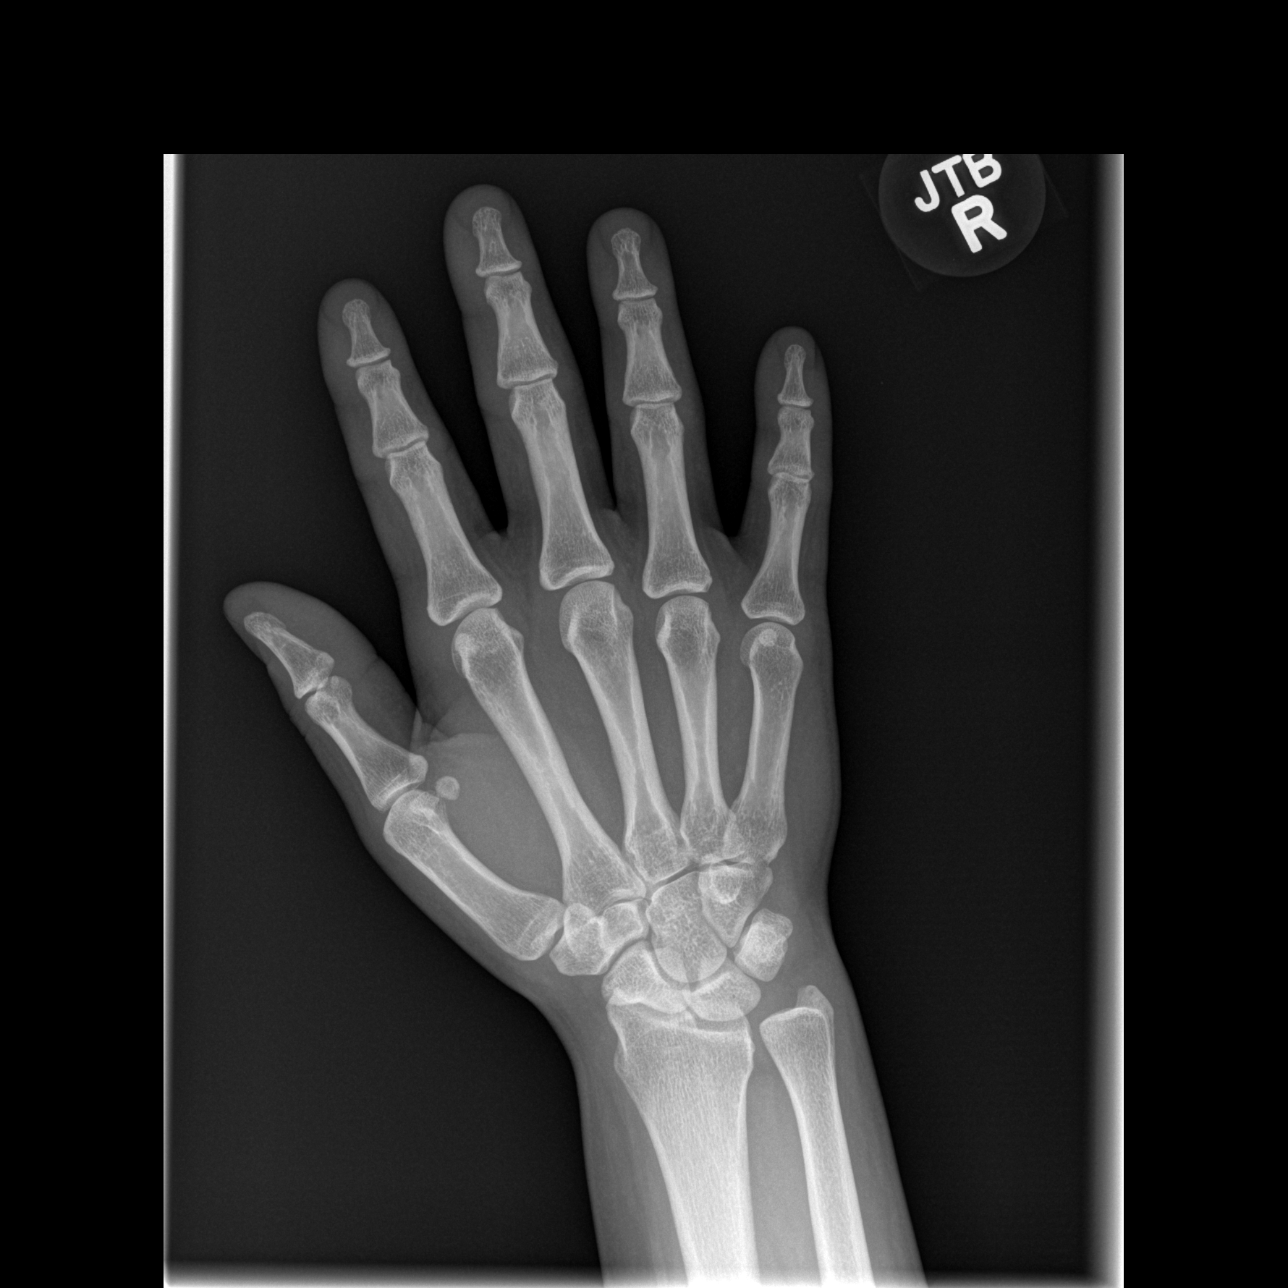

[x hand oblique right]
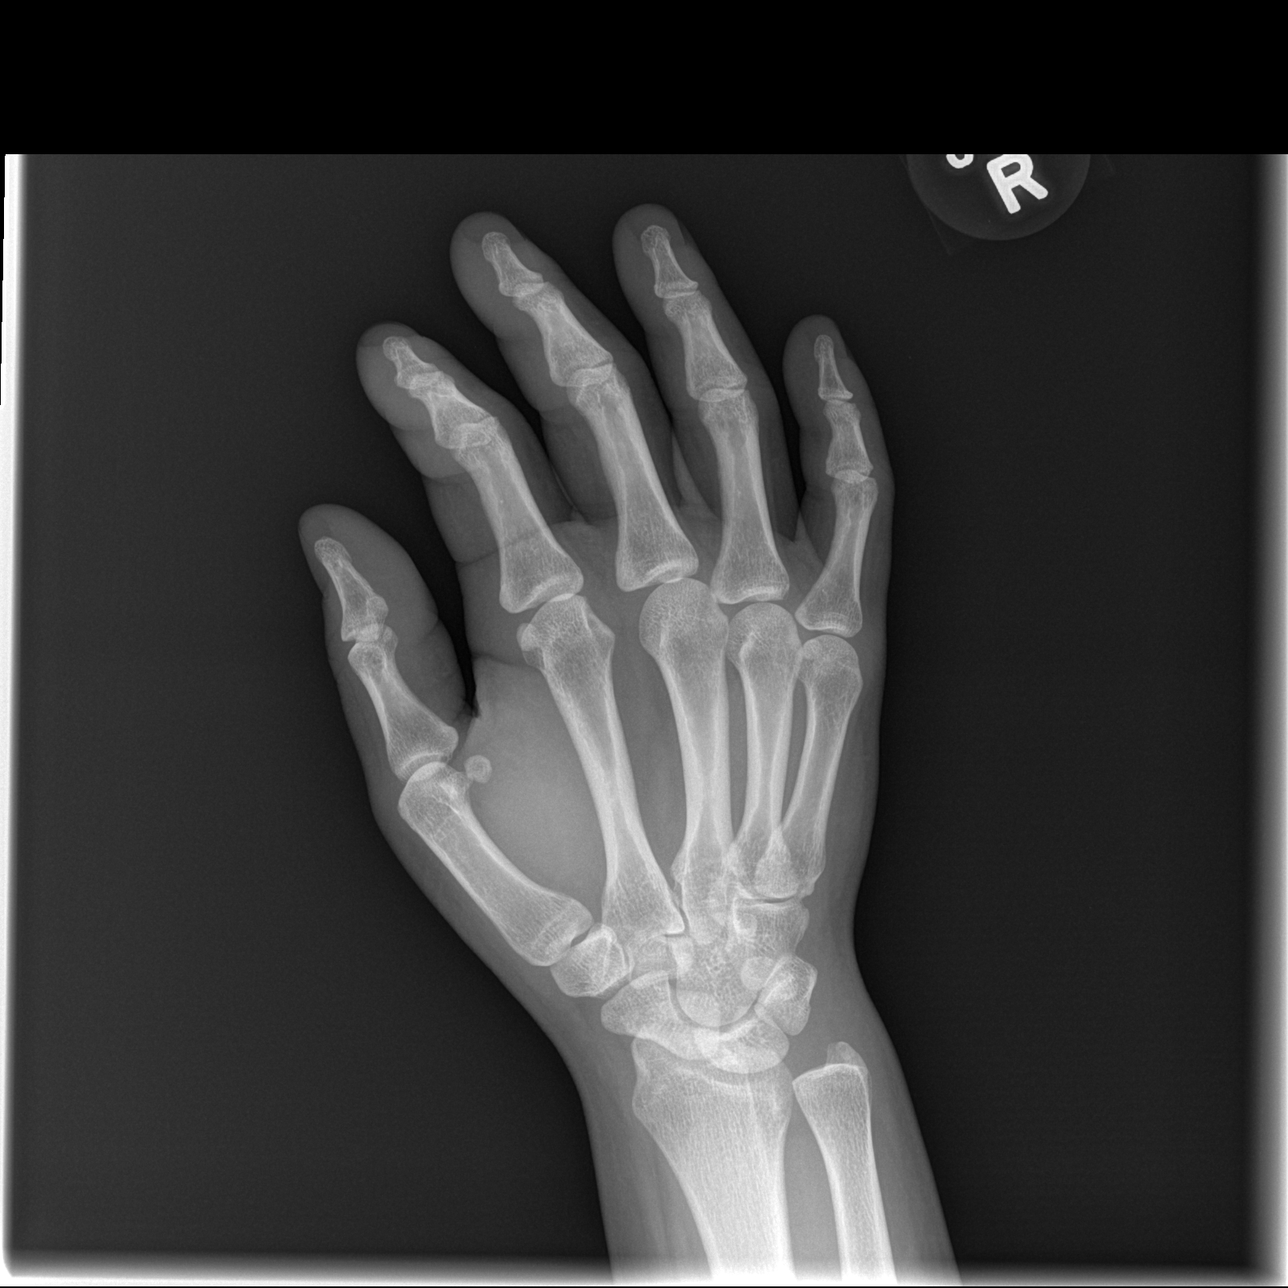

[x hand lat right]
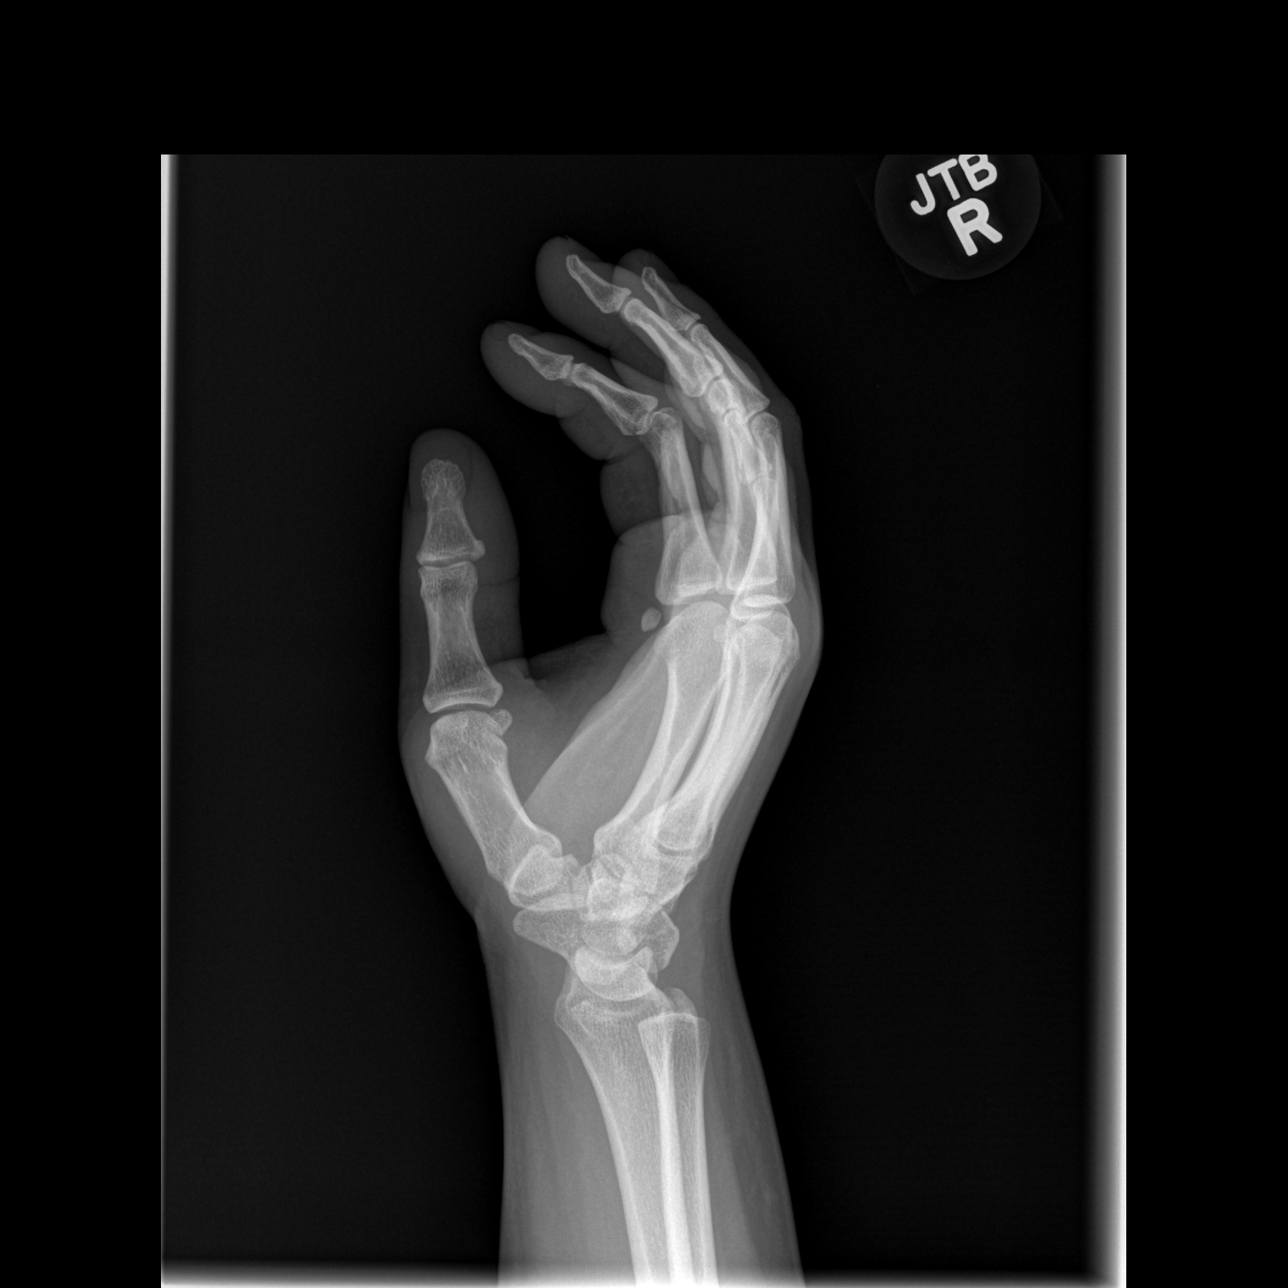

[3 of 3 positions shown; findings below may reference images not displayed]

FINDINGS: Bone mineralization is within normal limits.  Distal
radius and ulna appear intact.  Carpal bone alignment within normal
limits.  Joint spaces preserved.  No fracture dislocation
identified.
IMPRESSION: No acute fracture or dislocation identified about the right hand.

## 2013-03-22 ENCOUNTER — Ambulatory Visit: Payer: Self-pay | Admitting: Obstetrics & Gynecology

## 2013-03-26 ENCOUNTER — Ambulatory Visit (INDEPENDENT_AMBULATORY_CARE_PROVIDER_SITE_OTHER): Payer: BC Managed Care – PPO | Admitting: Medical

## 2013-03-26 ENCOUNTER — Encounter: Payer: Self-pay | Admitting: Medical

## 2013-03-26 VITALS — BP 110/80 | HR 80 | Temp 98.1°F | Resp 18 | Wt 219.0 lb

## 2013-03-26 DIAGNOSIS — H1045 Other chronic allergic conjunctivitis: Secondary | ICD-10-CM

## 2013-03-26 DIAGNOSIS — J309 Allergic rhinitis, unspecified: Secondary | ICD-10-CM

## 2013-03-26 DIAGNOSIS — I10 Essential (primary) hypertension: Secondary | ICD-10-CM

## 2013-03-26 DIAGNOSIS — H1013 Acute atopic conjunctivitis, bilateral: Secondary | ICD-10-CM

## 2013-03-26 NOTE — Progress Notes (Signed)
Subjective:  Christina Payne is a 27 y.o. female who presents as a new patient today.  Was seeing [facility] prior when she had medicaid.  Here for evaluation and treatment of allergic symptoms. Symptoms include: clear rhinorrhea, itchy eyes, itchy nose, nasal congestion, sinus pressure and watery eyes .  This is the first year this has been a big issue.   Treatment currently includes oral antihistamines: [employer] non drowsy brand but this does make her sleepy and is not effective.  Denies fever, sore throat, NVD, no wheezing or SOB.  No blood or colored drainage from nose.  In the past was on prescription allergy medication, thinks it was Claritin.  Doesn't like nasal sprays.  Hx/o HTN, was on metoprolol a year ago, but after losing 50lb has done better.  Still trying to lose more weight. Hx/o hospitalized due to uncontrolled hypertension.   The following portions of the patient's history were reviewed and updated as appropriate: allergies, current medications, past family history, past medical history, past social history, past surgical history and problem list.  Past Medical History  Diagnosis Date  . GERD (gastroesophageal reflux disease)   . Sinus tachycardia   . Allergic rhinitis   . Hypertension     hospitalized 11/01/10   ROS as in subjective   Objective: Filed Vitals:   03/26/13 1026  BP: 110/80  Pulse: 80  Temp: 98.1 F (36.7 C)  Resp: 18    General appearance: Alert, WD/WN, no distress                             Skin: warm, no rash                           Head: no sinus tenderness                            Eyes: conjunctiva mildly injected, corneas clear, PERRLA                            Ears: pearly TMs, external ear canals normal                          Nose: septum midline, turbinates swollen, no erythema, clear discharge                  Mouth/throat: MMM, tongue normal, no pharyngeal erythema or exudate                           Neck: supple, no  adenopathy, no thyromegaly, non tender                          Heart: RRR, normal S1, S2, no murmurs                         Lungs: CTA bilaterally, no wheezes, rales, or rhonchi   Assessment:   Encounter Diagnoses  Name Primary?  . Allergic rhinitis Yes  . Allergic conjunctivitis of both eyes   . Essential hypertension, benign      Plan:    Your symptoms today do suggest allergies or Allergic rhinitis and conjunctivitis   Begin Zyrtec (cetirizine) 10mg  daily at  bedtime OTC  Begin an OTC allergy eye drop.  Ask pharmacist for a recommendation  Consider nasal saline/Salt water rinse once to twice daily to clear out mucous and pollen  Don't use Afrin OTC  Don't use OTC decongestants or sudafed given your blood pressure history  If this isn't helping after 1-2 weeks, then we can consider recheck to add a prescription medication  HTN - check BP at pharmacy once every 2 week or so.   Return for physical soon

## 2013-03-26 NOTE — Patient Instructions (Addendum)
Your symptoms today do suggest allergies or Allergic rhinitis and conjunctivitis   Begin Zyrtec (cetirizine) 10mg  daily at bedtime OTC  Begin an OTC allergy eye drop.  Ask pharmacist for a recommendation  Consider nasal saline/Salt water rinse once to twice daily to clear out mucous and pollen  Don't use Afrin OTC  Don't use OTC decongestants or sudafed given your blood pressure history  If this isn't helping after 1-2 weeks, then we can consider recheck to add a prescription medication  Allergic Rhinitis Allergic rhinitis is when the mucous membranes in the nose respond to allergens. Allergens are particles in the air that cause your body to have an allergic reaction. This causes you to release allergic antibodies. Through a chain of events, these eventually cause you to release histamine into the blood stream (hence the use of antihistamines). Although meant to be protective to the body, it is this release that causes your discomfort, such as frequent sneezing, congestion and an itchy runny nose.  CAUSES  The pollen allergens may come from grasses, trees, and weeds. This is seasonal allergic rhinitis, or "hay fever." Other allergens cause year-round allergic rhinitis (perennial allergic rhinitis) such as house dust mite allergen, pet dander and mold spores.  SYMPTOMS   Nasal stuffiness (congestion).  Runny, itchy nose with sneezing and tearing of the eyes.  There is often an itching of the mouth, eyes and ears. It cannot be cured, but it can be controlled with medications. DIAGNOSIS  If you are unable to determine the offending allergen, skin or blood testing may find it. TREATMENT   Avoid the allergen.  Medications and allergy shots (immunotherapy) can help.  Hay fever may often be treated with antihistamines in pill or nasal spray forms. Antihistamines block the effects of histamine. There are over-the-counter medicines that may help with nasal congestion and swelling around  the eyes. Check with your caregiver before taking or giving this medicine. If the treatment above does not work, there are many new medications your caregiver can prescribe. Stronger medications may be used if initial measures are ineffective. Desensitizing injections can be used if medications and avoidance fails. Desensitization is when a patient is given ongoing shots until the body becomes less sensitive to the allergen. Make sure you follow up with your caregiver if problems continue. SEEK MEDICAL CARE IF:   You develop fever (more than 100.5 F (38.1 C).  You develop a cough that does not stop easily (persistent).  You have shortness of breath.  You start wheezing.  Symptoms interfere with normal daily activities. Document Released: 07/23/2001 Document Revised: 01/20/2012 Document Reviewed: 02/01/2009 [facility] Patient Information 2013 [place], [place].   Allergic Conjunctivitis The conjunctiva is a thin membrane that covers the visible white part of the eyeball and the underside of the eyelids. This membrane protects and lubricates the eye. The membrane has small blood vessels running through it that can normally be seen. When the conjunctiva becomes inflamed, the condition is called conjunctivitis. In response to the inflammation, the conjunctival blood vessels become swollen. The swelling results in redness in the normally white part of the eye. The blood vessels of this membrane also react when a person has allergies and is then called allergic conjunctivitis. This condition usually lasts for as long as the allergy persists. Allergic conjunctivitis cannot be passed to another person (non-contagious). The likelihood of bacterial infection is great and the cause is not likely due to allergies if the inflamed eye has:  A sticky discharge.  Discharge  or sticking together of the lids in the morning.  Scaling or flaking of the eyelids where the eyelashes come out.  Red swollen  eyelids. CAUSES   Viruses.  Irritants such as foreign bodies.  Chemicals.  General allergic reactions.  Inflammation or serious diseases in the inside or the outside of the eye or the orbit (the boney cavity in which the eye sits) can cause a "red eye." SYMPTOMS   Eye redness.  Tearing.  Itchy eyes.  Burning feeling in the eyes.  Clear drainage from the eye.  Allergic reaction due to pollens or ragweed sensitivity. Seasonal allergic conjunctivitis is frequent in the spring when pollens are in the air and in the fall. DIAGNOSIS  This condition, in its many forms, is usually diagnosed based on the history and an ophthalmological exam. It usually involves both eyes. If your eyes react at the same time every year, allergies may be the cause. While most "red eyes" are due to allergy or an infection, the role of an eye (ophthalmological) exam is important. The exam can rule out serious diseases of the eye or orbit. TREATMENT   Non-antibiotic eye drops, ointments, or medications by mouth may be prescribed if the ophthalmologist is sure the conjunctivitis is due to allergies alone.  Over-the-counter drops and ointments for allergic symptoms should be used only after other causes of conjunctivitis have been ruled out, or as your caregiver suggests. Medications by mouth are often prescribed if other allergy-related symptoms are present. If the ophthalmologist is sure that the conjunctivitis is due to allergies alone, treatment is normally limited to drops or ointments to reduce itching and burning. HOME CARE INSTRUCTIONS   Wash hands before and after applying drops or ointments, or touching the inflamed eye(s) or eyelids.  Do not let the eye dropper tip or ointment tube touch the eyelid when putting medicine in your eye.  Stop using your soft contact lenses and throw them away. Use a new pair of lenses when recovery is complete. You should run through sterilizing cycles at least three  times before use after complete recovery if the old soft contact lenses are to be used. Hard contact lenses should be stopped. They need to be thoroughly sterilized before use after recovery.  Itching and burning eyes due to allergies is often relieved by using a cool cloth applied to closed eye(s). SEEK MEDICAL CARE IF:   Your problems do not go away after two or three days of treatment.  Your lids are sticky (especially in the morning when you wake up) or stick together.  Discharge develops. Antibiotics may be needed either as drops, ointment, or by mouth.  You have extreme light sensitivity.  An oral temperature above 102 F (38.9 C) develops.  Pain in or around the eye or any other visual symptom develops. MAKE SURE YOU:   Understand these instructions.  Will watch your condition.  Will get help right away if you are not doing well or get worse. Document Released: 01/18/2003 Document Revised: 01/20/2012 Document Reviewed: 12/14/2007 [facility] Patient Information 2013 [place], [place].

## 2013-04-06 ENCOUNTER — Inpatient Hospital Stay (HOSPITAL_COMMUNITY)
Admission: AD | Admit: 2013-04-06 | Discharge: 2013-04-06 | Disposition: A | Discharge status: Home / Self Care | Payer: BC Managed Care – PPO | Source: Ambulatory Visit | Attending: Obstetrics | Admitting: Obstetrics

## 2013-04-06 ENCOUNTER — Encounter (HOSPITAL_COMMUNITY): Payer: Self-pay

## 2013-04-06 DIAGNOSIS — N946 Dysmenorrhea, unspecified: Secondary | ICD-10-CM

## 2013-04-06 DIAGNOSIS — N938 Other specified abnormal uterine and vaginal bleeding: Secondary | ICD-10-CM | POA: Insufficient documentation

## 2013-04-06 DIAGNOSIS — N949 Unspecified condition associated with female genital organs and menstrual cycle: Secondary | ICD-10-CM

## 2013-04-06 DIAGNOSIS — Z30431 Encounter for routine checking of intrauterine contraceptive device: Secondary | ICD-10-CM | POA: Insufficient documentation

## 2013-04-06 HISTORY — DX: Reserved for concepts with insufficient information to code with codable children: IMO0002

## 2013-04-06 HISTORY — DX: Unspecified abnormal cytological findings in specimens from cervix uteri: R87.619

## 2013-04-06 LAB — CBC
MCV: 88.4 fL (ref 78.0–100.0)
Platelets: 230 10*3/uL (ref 150–400)
RBC: 4.32 MIL/uL (ref 3.87–5.11)
WBC: 7 10*3/uL (ref 4.0–10.5)

## 2013-04-06 LAB — POCT PREGNANCY, URINE: Preg Test, Ur: NEGATIVE

## 2013-04-06 LAB — URINE MICROSCOPIC-ADD ON

## 2013-04-06 LAB — URINALYSIS, ROUTINE W REFLEX MICROSCOPIC
Nitrite: NEGATIVE
Specific Gravity, Urine: <1.005 — ABNORMAL LOW (ref 1.005–1.030)
Urobilinogen, UA: 0.2 mg/dL (ref 0.0–1.0)
pH: 6.5 (ref 5.0–8.0)

## 2013-04-06 MED ORDER — NORGESTIMATE-ETH ESTRADIOL 0.25-35 MG-MCG PO TABS: 1.0000 | ORAL_TABLET | Freq: Every day | ORAL | Status: DC

## 2013-04-06 MED ORDER — KETOROLAC TROMETHAMINE 10 MG PO TABS
10.0000 mg | ORAL_TABLET | Freq: Once | ORAL | Status: AC
  Administered 2013-04-06: 10 mg via ORAL
  Filled 2013-04-06: qty 1

## 2013-04-06 NOTE — MAU Note (Signed)
Implanon x 2 years. Irregular bleeding the first 8 months of implanon, then stopped having periods. LLQ cramping and vaginal bleeding with large clots since this morning.

## 2013-04-06 NOTE — MAU Provider Note (Signed)
Chief Complaint: Vaginal Bleeding   First Provider Initiated Contact with Patient 04/06/13 0414     SUBJECTIVE HPI: Atiya Payne is a 27 y.o. [id number] who presents to maternity admissions reporting heavy vaginal bleeding and passing multiple half-dollar-sized clots since this morning. Also reports intermittent left lower quadrant cramping that she rates 5/10 on pain scale at worst. Minimal pain now. Had Implanon placed 2 years ago. Irregular spotting x8 months, then minimal bleeding for more than a year. Bleeding started in 03/23/2003, light initially.  Denies fever, chills, dyspareunia, vaginal discharge, dizziness.  Past Medical History  Diagnosis Date  . GERD (gastroesophageal reflux disease)   . Sinus tachycardia   . Allergic rhinitis   . Hypertension     hospitalized 11/01/10  . Wears glasses   . Abnormal Pap smear    OB History   Grav Para Term Preterm Abortions TAB SAB Ect Mult Living   3 2 2  1  1   2      # Outc Date GA Lbr Len/2nd Wgt Sex Del Anes PTL Lv   1 TRM      LTCS   Yes   2 TRM      SVD EPI  Yes   3 SAB              Past Surgical History  Procedure Laterality Date  . Cesarean section     History   Social History  . Marital Status: Married    Spouse Name: N/A    Number of Children: N/A  . Years of Education: N/A   Occupational History  . Not on file.   Social History Main Topics  . Smoking status: Never Smoker   . Smokeless tobacco: Not on file  . Alcohol Use: 1.2 oz/week    2 Glasses of wine per week     Comment: occasional  . Drug Use: No  . Sexually Active: Yes    Birth Control/ Protection: Implant   Other Topics Concern  . Not on file   Social History Narrative   Lives with husband and children   No current facility-administered medications on file prior to encounter.   Current Outpatient Prescriptions on File Prior to Encounter  Medication Sig Dispense Refill  . etonogestrel (IMPLANON) 68 MG IMPL implant Inject 1 each into the  skin once.      . loratadine (CLARITIN) 10 MG tablet Take 10 mg by mouth daily.      . Multiple Vitamin (MULTIVITAMIN WITH MINERALS) TABS Take 1 tablet by mouth daily.      . Acetaminophen (TYLENOL PO) Take 2 tablets by mouth every 4 (four) hours as needed. For pain      . ibuprofen (ADVIL,MOTRIN) 800 MG tablet Take 800 mg by mouth every 12 (twelve) hours as needed. For pain      . [DISCONTINUED] omeprazole (PRILOSEC) 20 MG capsule Take 20 mg by mouth daily.         No Known Allergies  ROS: Pertinent items in HPI  OBJECTIVE Blood pressure 123/72, pulse 97, temperature 99.4 F (37.4 C), temperature source Oral, resp. rate 16, height 5' 0.5" (1.537 m), weight 99.519 kg (219 lb 6.4 oz), SpO2 100.00%. GENERAL: Well-developed, well-nourished female in no acute distress.  HEENT: Normocephalic HEART: normal rate RESP: normal effort ABDOMEN: Soft, non-tender, obese EXTREMITIES: Nontender, no edema NEURO: Alert and oriented SPECULUM EXAM: NEFG, physiologic discharge, moderate amount of dark red blood noted, a few tiny clots, cervix clean BIMANUAL: cervix closed; uterus  normal size, no adnexal tenderness or masses  LAB RESULTS Results for orders placed during the hospital encounter of 04/06/13 (from the past 24 hour(s))  CBC     Status: None   Collection Time    04/06/13  3:00 AM      Result Value Range   WBC 7.0  4.0 - 10.5 K/uL   RBC 4.32  3.87 - 5.11 MIL/uL   Hemoglobin 12.9  12.0 - 15.0 g/dL   HCT 16.1  09.6 - 04.5 %   MCV 88.4  78.0 - 100.0 fL   MCH 29.9  26.0 - 34.0 pg   MCHC 33.8  30.0 - 36.0 g/dL   RDW 40.9  81.1 - 91.4 %   Platelets 230  150 - 400 K/uL  URINALYSIS, ROUTINE W REFLEX MICROSCOPIC     Status: Abnormal   Collection Time    04/06/13  4:32 AM      Result Value Range   Color, Urine YELLOW  YELLOW   APPearance CLEAR  CLEAR   Specific Gravity, Urine <1.005 (*) 1.005 - 1.030   pH 6.5  5.0 - 8.0   Glucose, UA NEGATIVE  NEGATIVE mg/dL   Hgb urine dipstick LARGE (*)  NEGATIVE   Bilirubin Urine NEGATIVE  NEGATIVE   Ketones, ur NEGATIVE  NEGATIVE mg/dL   Protein, ur NEGATIVE  NEGATIVE mg/dL   Urobilinogen, UA 0.2  0.0 - 1.0 mg/dL   Nitrite NEGATIVE  NEGATIVE   Leukocytes, UA NEGATIVE  NEGATIVE  URINE MICROSCOPIC-ADD ON     Status: Abnormal   Collection Time    04/06/13  4:32 AM      Result Value Range   Squamous Epithelial / LPF RARE  RARE   WBC, UA 0-2  <3 WBC/hpf   RBC / HPF 7-10  <3 RBC/hpf   Bacteria, UA FEW (*) RARE  POCT PREGNANCY, URINE     Status: None   Collection Time    04/06/13  4:46 AM      Result Value Range   Preg Test, Ur NEGATIVE  NEGATIVE    IMAGING No results found.  MAU COURSE  ASSESSMENT 1. Heavy vaginal bleeding due to contraceptive implant use, initial encounter   2. Dysmenorrhea     PLAN Discharge home in stable condition per consult with Dr. Clearance Coots. Bleeding precautions.     Follow-up Information   Follow up with HARPER,CHARLES A, MD. (As needed if symptoms do not resolve.)    Contact information:   [address] [place] [address] [phone]        Medication List    TAKE these medications       aspirin-acetaminophen-caffeine 250-250-65 MG per tablet  Commonly known as:  EXCEDRIN MIGRAINE  Take 2 tablets by mouth every 6 (six) hours as needed for pain.     etonogestrel 68 MG Impl implant  Commonly known as:  IMPLANON  Inject 1 each into the skin once.     ibuprofen 800 MG tablet  Commonly known as:  ADVIL,MOTRIN  Take 800 mg by mouth every 12 (twelve) hours as needed. For pain     loratadine 10 MG tablet  Commonly known as:  CLARITIN  Take 10 mg by mouth daily.     multivitamin with minerals Tabs  Take 1 tablet by mouth daily.     norgestimate-ethinyl estradiol 0.25-35 MG-MCG tablet  Commonly known as:  SPRINTEC 28  Take 1 tablet by mouth daily.  TYLENOL PO  Take 2 tablets by mouth every 4 (four) hours as needed. For pain       [place] 04/06/2013  5:46 AM

## 2013-04-26 ENCOUNTER — Ambulatory Visit (INDEPENDENT_AMBULATORY_CARE_PROVIDER_SITE_OTHER): Payer: BC Managed Care – PPO | Admitting: Medical

## 2013-04-26 ENCOUNTER — Encounter: Payer: Self-pay | Admitting: Medical

## 2013-04-26 VITALS — BP 110/80 | HR 68 | Temp 98.3°F | Resp 16 | Ht 61.0 in | Wt 218.0 lb

## 2013-04-26 DIAGNOSIS — R1013 Epigastric pain: Secondary | ICD-10-CM

## 2013-04-26 DIAGNOSIS — Z0000 Encounter for general adult medical examination without abnormal findings: Secondary | ICD-10-CM

## 2013-04-26 DIAGNOSIS — K5900 Constipation, unspecified: Secondary | ICD-10-CM

## 2013-04-26 DIAGNOSIS — Z23 Encounter for immunization: Secondary | ICD-10-CM

## 2013-04-26 DIAGNOSIS — E669 Obesity, unspecified: Secondary | ICD-10-CM

## 2013-04-26 LAB — COMPREHENSIVE METABOLIC PANEL
BUN: 8 mg/dL (ref 6–23)
CO2: 25 mEq/L (ref 19–32)
Calcium: 8.8 mg/dL (ref 8.4–10.5)
Chloride: 105 mEq/L (ref 96–112)
Creat: 0.7 mg/dL (ref 0.50–1.10)

## 2013-04-26 LAB — POCT URINALYSIS DIPSTICK
Bilirubin, UA: NEGATIVE
Blood, UA: NEGATIVE
Ketones, UA: NEGATIVE
pH, UA: 6

## 2013-04-26 LAB — LIPID PANEL
Cholesterol: 118 mg/dL (ref 0–200)
HDL: 35 mg/dL — ABNORMAL LOW (ref >39)
Total CHOL/HDL Ratio: 3.4 Ratio

## 2013-04-26 NOTE — Progress Notes (Signed)
Subjective:   HPI  Christina Payne is a 27 y.o. female who presents for a complete physical.  Preventative care: Last ophthalmology visit:yes-unsure of the name Last dental visit:n/a Last colonoscopy:n/a Last mammogram:n/a Last gynecological exam:July/ 2014 Last EKG:05/05/2012 Last labs: 2+ years ago?  Prior vaccinations: TD or Tdap:over 10 years ago Influenza:n/a Pneumococcal:n/a Shingles/Zostavax:n/a  Advanced directive:n/a Health care power of attorney:n/a Living will:n/a  Concerns: Gets feeling of something sitting on her epigastric area.  Feels like if she could burp good, could get this to resolve.   Has hx/o GERD.   Not currently using medication for this.  Denies frequent NSAID use.  Has BM about every 2-3 days, does get backed up, constipated feeling.   Reviewed their medical, surgical, family, social, medication, and allergy history and updated chart as appropriate.   Past Medical History  Diagnosis Date  . GERD (gastroesophageal reflux disease)   . Allergic rhinitis   . Hypertension     hospitalized 11/01/10  . Wears glasses   . Abnormal Pap smear     Dr. Clearance Coots  . Obesity   . Contraception management     [facility], Dr. Clearance Coots  . Dysmenorrhea     on Implanon + OCP per Dr. Clearance Coots  . Pulmonary edema 12/11    postpartum, hospitalized;      Past Surgical History  Procedure Laterality Date  . Cesarean section  01/2008    Family History  Problem Relation Age of Onset  . Hypertension Mother   . Fibromyalgia Mother   . Asthma Mother   . Allergic rhinitis Mother   . Other Father     unknown  . Other Sister     died at birth, mother had eclampsia  . Allergic rhinitis Brother   . Cancer Paternal Grandmother     lung  . Diabetes Paternal Grandmother   . Heart disease Neg Hx   . Other Brother     auditory processing disorder, eating disorder, sleeping disorder    History   Social History  . Marital Status: Married    Spouse Name:  N/A    Number of Children: N/A  . Years of Education: N/A   Occupational History  . Not on file.   Social History Main Topics  . Smoking status: Never Smoker   . Smokeless tobacco: Not on file  . Alcohol Use: 1.2 oz/week    2 Glasses of wine per week     Comment: occasional  . Drug Use: No  . Sexually Active: Yes    Birth Control/ Protection: Implant   Other Topics Concern  . Not on file   Social History Narrative   Lives with husband and children, ages 8yo and 2yo, Ephriam Knuckles, exercise - walks, works at [employer]    Current Outpatient Prescriptions on File Prior to Visit  Medication Sig Dispense Refill  . Acetaminophen (TYLENOL PO) Take 2 tablets by mouth every 4 (four) hours as needed. For pain      . aspirin-acetaminophen-caffeine (EXCEDRIN MIGRAINE) 250-250-65 MG per tablet Take 2 tablets by mouth every 6 (six) hours as needed for pain.      Marland Kitchen etonogestrel (IMPLANON) 68 MG IMPL implant Inject 1 each into the skin once.      Marland Kitchen ibuprofen (ADVIL,MOTRIN) 800 MG tablet Take 800 mg by mouth every 12 (twelve) hours as needed. For pain      . loratadine (CLARITIN) 10 MG tablet Take 10 mg by mouth daily.      Marland Kitchen  Multiple Vitamin (MULTIVITAMIN WITH MINERALS) TABS Take 1 tablet by mouth daily.      . [DISCONTINUED] omeprazole (PRILOSEC) 20 MG capsule Take 20 mg by mouth daily.         No current facility-administered medications on file prior to visit.    No Known Allergies   Review of Systems Constitutional: -fever, -chills, -sweats, -unexpected weight change, -decreased appetite, -fatigue Allergy: -sneezing, -itching, -congestion Dermatology: -changing moles, --rash, -lumps ENT: +runny nose, -ear pain, -sore throat, -hoarseness, -sinus pain, -teeth pain, + ringing in ears, -hearing loss, -nosebleeds Cardiology: -chest pain, -palpitations, -swelling, -difficulty breathing when lying flat, -waking up short of breath Respiratory: -cough, -shortness of breath, -difficulty  breathing with exercise or exertion, -wheezing, -coughing up blood Gastroenterology: -abdominal pain, +nausea, -vomiting, -diarrhea, +constipation, -blood in stool, -changes in bowel movement, -difficulty swallowing or eating Hematology: -bleeding, -bruising  Musculoskeletal: -joint aches, -muscle aches, -joint swelling, -back pain, -neck pain, -cramping, -changes in gait Ophthalmology: denies vision changes, eye redness, itching, discharge Urology: -burning with urination, -difficulty urinating, -blood in urine, -urinary frequency, -urgency, -incontinence Neurology: -headache, -weakness, -tingling, -numbness, -memory loss, -falls, -dizziness Psychology: -depressed mood, -agitation, -sleep problems     Objective:   Physical Exam  Vitals and nurse notes reviewed  General appearance: alert, no distress, WD/WN, obese AA female Skin: tattoos bilat upper arms, left breast HEENT: normocephalic, conjunctiva/corneas normal, sclerae anicteric, PERRLA, EOMi, nares patent, no discharge or erythema, pharynx normal Oral cavity: MMM, tongue normal, teeth in good repair Neck: supple, no lymphadenopathy, no thyromegaly, no masses, normal ROM Chest: non tender, normal shape and expansion Heart: RRR, normal S1, S2, no murmurs Lungs: CTA bilaterally, no wheezes, rhonchi, or rales Abdomen: +bs, soft, tender epigastric region, otherwise non tender, non distended, no masses, no hepatomegaly, no splenomegaly, no bruits Back: non tender, normal ROM, no scoliosis Musculoskeletal: upper extremities non tender, no obvious deformity, normal ROM throughout, lower extremities non tender, no obvious deformity, normal ROM throughout Extremities: no edema, no cyanosis, no clubbing Pulses: 2+ symmetric, upper and lower extremities, normal cap refill Neurological: alert, oriented x 3, CN2-12 intact, strength normal upper extremities and lower extremities, sensation normal throughout, DTRs 2+ throughout, no cerebellar  signs, gait normal Psychiatric: normal affect, behavior normal, pleasant  Breast/gyn/rectal - deferred to gynecology   Assessment and Plan :    Encounter Diagnoses  Name Primary?  . Routine general medical examination at a health care facility Yes  . Obesity, unspecified   . Unspecified constipation   . Epigastric pressure      Physical exam - discussed healthy lifestyle, diet, exercise, preventative care, vaccinations, and addressed their concerns.  Handout given.  Obesity - discussed diet, exercise, weight loss changes  Constipation - discused fiber, water intake, work on dietary changes, f/u 83mo  Epigastric pressure - work on constipation measures, begin OTC Prilosec x 2 wk trial  Follow-up pending labs

## 2013-04-26 NOTE — Patient Instructions (Signed)
Begin OTC Prilosec 20mg  1 tablet daily in the morning 45 minutes prior to breakfast daily x 2 weeks.     Work on getting more fiber and water in [employer].  Work on diet changes, exercise, weight loss efforts.  recheck in 3-4 weeks regarding upper belly discomfort.    I have included some information below regarding constipation, GERD.   Constipation, Adult Constipation is when a person has fewer than 3 bowel movements a week; has difficulty having a bowel movement; or has stools that are dry, hard, or larger than normal. As people grow older, constipation is more common. If you try to fix constipation with medicines that make you have a bowel movement (laxatives), the problem may get worse. Long-term laxative use may cause the muscles of the colon to become weak. A low-fiber diet, not taking in enough fluids, and taking certain medicines may make constipation worse. CAUSES   Certain medicines, such as antidepressants, pain medicine, iron supplements, antacids, and water pills.   Certain diseases, such as diabetes, irritable bowel syndrome (IBS), thyroid disease, or depression.   Not drinking enough water.   Not eating enough fiber-rich foods.   Stress or travel.  Lack of physical activity or exercise.  Not going to the restroom when there is the urge to have a bowel movement.  Ignoring the urge to have a bowel movement.  Using laxatives too much. SYMPTOMS   Having fewer than 3 bowel movements a week.   Straining to have a bowel movement.   Having hard, dry, or larger than normal stools.   Feeling full or bloated.   Pain in the lower abdomen.  Not feeling relief after having a bowel movement. DIAGNOSIS  Your caregiver will take a medical history and perform a physical exam. Further testing may be done for severe constipation. Some tests may include:   A barium enema X-ray to examine your rectum, colon, and sometimes, your small intestine.  A sigmoidoscopy to  examine your lower colon.  A colonoscopy to examine your entire colon. TREATMENT  Treatment will depend on the severity of your constipation and what is causing it. Some dietary treatments include drinking more fluids and eating more fiber-rich foods. Lifestyle treatments may include regular exercise. If these diet and lifestyle recommendations do not help, your caregiver may recommend taking over-the-counter laxative medicines to help you have bowel movements. Prescription medicines may be prescribed if over-the-counter medicines do not work.  HOME CARE INSTRUCTIONS   Increase dietary fiber in your diet, such as fruits, vegetables, whole grains, and beans. Limit high-fat and processed sugars in your diet, such as [place] fries, hamburgers, cookies, candies, and soda.   A fiber supplement may be added to your diet if you cannot get enough fiber from foods.   Drink enough fluids to keep your urine clear or pale yellow.   Exercise regularly or as directed by your caregiver.   Go to the restroom when you have the urge to go. Do not hold it.  Only take medicines as directed by your caregiver. Do not take other medicines for constipation without talking to your caregiver first. SEEK IMMEDIATE MEDICAL CARE IF:   You have bright red blood in your stool.   Your constipation lasts for more than 4 days or gets worse.   You have abdominal or rectal pain.   You have thin, pencil-like stools.  You have unexplained weight loss. MAKE SURE YOU:   Understand these instructions.  Will watch your condition.  Will get help right away if you are not doing well or get worse. Document Released: 07/26/2004 Document Revised: 01/20/2012 Document Reviewed: 10/01/2011 [facility] Patient Information 2014 [place], [place].   Gastroesophageal Reflux Disease, Adult Gastroesophageal reflux disease (GERD) happens when acid from your stomach flows up into the esophagus. When acid comes in contact with the  esophagus, the acid causes soreness (inflammation) in the esophagus. Over time, GERD may create small holes (ulcers) in the lining of the esophagus. CAUSES   Increased body weight. This puts pressure on the stomach, making acid rise from the stomach into the esophagus.   Smoking. This increases acid production in the stomach.   Drinking alcohol. This causes decreased pressure in the lower esophageal sphincter (valve or ring of muscle between the esophagus and stomach), allowing acid from the stomach into the esophagus.   Late evening meals and a full stomach. This increases pressure and acid production in the stomach.   A malformed lower esophageal sphincter.  Sometimes, no cause is found. SYMPTOMS   Burning pain in the lower part of the mid-chest behind the breastbone and in the mid-stomach area. This may occur twice a week or more often.   Trouble swallowing.   Sore throat.   Dry cough.   Asthma-like symptoms including chest tightness, shortness of breath, or wheezing.  DIAGNOSIS  Your caregiver may be able to diagnose GERD based on your symptoms. In some cases, X-rays and other tests may be done to check for complications or to check the condition of your stomach and esophagus. TREATMENT  Your caregiver may recommend over-the-counter or prescription medicines to help decrease acid production. Ask your caregiver before starting or adding any new medicines.  HOME CARE INSTRUCTIONS   Change the factors that you can control. Ask your caregiver for guidance concerning weight loss, quitting smoking, and alcohol consumption.   Avoid foods and drinks that make your symptoms worse, such as:   Caffeine or alcoholic drinks.   Chocolate.   Peppermint or mint flavorings.   Garlic and onions.   Spicy foods.   Citrus fruits, such as oranges, lemons, or limes.   Tomato-based foods such as sauce, chili, salsa, and pizza.   Fried and fatty foods.   Avoid lying down for the 3 hours  prior to your bedtime or prior to taking a nap.   Eat small, frequent meals instead of large meals.   Wear loose-fitting clothing. Do not wear anything tight around your waist that causes pressure on your stomach.   Raise the head of your bed 6 to 8 inches with wood blocks to help you sleep. Extra pillows will not help.   Only take over-the-counter or prescription medicines for pain, discomfort, or fever as directed by your caregiver.   Do not take aspirin, ibuprofen, or other nonsteroidal anti-inflammatory drugs (NSAIDs).  SEEK IMMEDIATE MEDICAL CARE IF:   You have pain in your arms, neck, jaw, teeth, or back.   Your pain increases or changes in intensity or duration.   You develop nausea, vomiting, or sweating (diaphoresis).   You develop shortness of breath, or you faint.   Your vomit is green, yellow, black, or looks like coffee grounds or blood.   Your stool is red, bloody, or black.  These symptoms could be signs of other problems, such as heart disease, gastric bleeding, or esophageal bleeding. MAKE SURE YOU:   Understand these instructions.   Will watch your condition.   Will get help right away if you are  not doing well or get worse.  Document Released: 08/07/2005 Document Revised: 07/10/2011 Document Reviewed: 05/17/2011 [facility] Patient Information 2012 [place], [place].

## 2013-04-26 NOTE — Addendum Note (Signed)
Addended by: Leretha Dykes L on: 04/26/2013 10:50 AM   Modules accepted: Orders

## 2013-05-13 ENCOUNTER — Ambulatory Visit: Payer: Self-pay | Admitting: Obstetrics & Gynecology

## 2013-05-19 ENCOUNTER — Encounter: Payer: Self-pay | Admitting: Obstetrics

## 2013-05-19 ENCOUNTER — Ambulatory Visit (INDEPENDENT_AMBULATORY_CARE_PROVIDER_SITE_OTHER): Payer: BC Managed Care – PPO | Admitting: Obstetrics

## 2013-05-19 VITALS — BP 122/83 | HR 84 | Ht 61.0 in | Wt 215.0 lb

## 2013-05-19 DIAGNOSIS — Z01419 Encounter for gynecological examination (general) (routine) without abnormal findings: Secondary | ICD-10-CM

## 2013-05-19 DIAGNOSIS — Z3046 Encounter for surveillance of implantable subdermal contraceptive: Secondary | ICD-10-CM

## 2013-05-19 DIAGNOSIS — N939 Abnormal uterine and vaginal bleeding, unspecified: Secondary | ICD-10-CM | POA: Insufficient documentation

## 2013-05-19 DIAGNOSIS — Z113 Encounter for screening for infections with a predominantly sexual mode of transmission: Secondary | ICD-10-CM

## 2013-05-19 DIAGNOSIS — N760 Acute vaginitis: Secondary | ICD-10-CM

## 2013-05-19 DIAGNOSIS — N926 Irregular menstruation, unspecified: Secondary | ICD-10-CM

## 2013-05-19 NOTE — Progress Notes (Signed)
Subjective:     Christina Payne is a 27 y.o. female here for a routine exam.  Current complaints: annual exam. Pt states she she currently has the Implanon. Pt states she had it inserted about 2 years ago. Pt states she had not had a period in a year. Pt states she went to the emergency room in May 2014. Pt states it was extremely heavy.  Personal health questionnaire reviewed: yes.   Gynecologic History No LMP recorded. Patient has had an implant. Contraception: OCP (estrogen/progesterone) and Nexplanon Last Pap: 2012. Results were: normal Last mammogram: n/a. Results were: n/a  Obstetric History OB History   Grav Para Term Preterm Abortions TAB SAB Ect Mult Living   3 2 2  1  1   2      # Outc Date GA Lbr Len/2nd Wgt Sex Del Anes PTL Lv   1 TRM      LTCS   Yes   2 TRM      SVD EPI  Yes   3 SAB                The following portions of the patient's history were reviewed and updated as appropriate: allergies, current medications, past family history, past medical history, past social history, past surgical history and problem list.  Review of Systems Pertinent items are noted in HPI.    Objective:    General appearance: alert and no distress Breasts: normal appearance, no masses or tenderness Abdomen: normal findings: soft, non-tender Pelvic: cervix normal in appearance, external genitalia normal, no adnexal masses or tenderness, no cervical motion tenderness, uterus normal size, shape, and consistency and vagina normal without discharge    Assessment:    Healthy female exam.    Plan:    Education reviewed: safe sex/STD prevention, self breast exams and management of Nexplanon. Contraception: Nexplanon. AUB with Nexplanon.  Unresolved with OCP's.  Patient desires removal.   Will schedule appointment for Nexplanon removal.

## 2013-05-19 NOTE — Addendum Note (Signed)
Addended by: Julaine Hua on: 05/19/2013 11:26 AM   Modules accepted: Orders

## 2013-05-20 LAB — WET PREP BY MOLECULAR PROBE: Gardnerella vaginalis: NEGATIVE

## 2013-05-21 LAB — PAP IG W/ RFLX HPV ASCU

## 2013-05-25 ENCOUNTER — Other Ambulatory Visit: Payer: Self-pay | Admitting: *Deleted

## 2013-05-25 DIAGNOSIS — B379 Candidiasis, unspecified: Secondary | ICD-10-CM

## 2013-05-25 MED ORDER — FLUCONAZOLE 150 MG PO TABS: 150.0000 mg | ORAL_TABLET | Freq: Once | ORAL | Status: DC

## 2013-06-03 ENCOUNTER — Encounter: Payer: Self-pay | Admitting: Obstetrics

## 2013-06-03 ENCOUNTER — Ambulatory Visit (INDEPENDENT_AMBULATORY_CARE_PROVIDER_SITE_OTHER): Payer: BC Managed Care – PPO | Admitting: Obstetrics

## 2013-06-03 VITALS — BP 118/87 | HR 83 | Temp 98.4°F | Ht 60.5 in | Wt 216.0 lb

## 2013-06-03 DIAGNOSIS — Z3009 Encounter for other general counseling and advice on contraception: Secondary | ICD-10-CM

## 2013-06-03 DIAGNOSIS — Z3046 Encounter for surveillance of implantable subdermal contraceptive: Secondary | ICD-10-CM

## 2013-06-03 DIAGNOSIS — Z30017 Encounter for initial prescription of implantable subdermal contraceptive: Secondary | ICD-10-CM | POA: Insufficient documentation

## 2013-06-03 MED ORDER — NORGESTIMATE-ETH ESTRADIOL 0.25-35 MG-MCG PO TABS: 1.0000 | ORAL_TABLET | Freq: Every day | ORAL | Status: DC

## 2013-06-03 NOTE — Progress Notes (Signed)

## 2013-06-03 NOTE — Progress Notes (Signed)
.   Subjective:     Christina Payne is a 27 y.o. female here for nexplanon removal.   Current complaints - she would like to start birth control pills..  Personal health questionnaire reviewed: yes.   Gynecologic History No LMP recorded. Patient has had an implant. Contraception: Nexplanon Last Pap: 05/2013. Results were: abnormal Last mammogram: N/A  Obstetric History OB History   Grav Para Term Preterm Abortions TAB SAB Ect Mult Living   3 2 2  1  1   2      # Outc Date GA Lbr Len/2nd Wgt Sex Del Anes PTL Lv   1 TRM      LTCS   Yes   2 TRM      SVD EPI  Yes   3 SAB                The following portions of the patient's history were reviewed and updated as appropriate: allergies, current medications, past family history, past medical history, past social history, past surgical history and problem list.  Review of Systems Pertinent items are noted in HPI.    Objective:     Left Arm:  Nexplanon rod palpated.     Assessment:    Nexplanon surveillance.   Plan:    Education reviewed: Contraceptive options.. Contraception: Wants OCP's. Follow up in: 2 weeks. Nexplanon removed TriSprintec 28 Rx.

## 2013-06-17 ENCOUNTER — Ambulatory Visit: Payer: BC Managed Care – PPO | Admitting: Obstetrics

## 2013-07-09 ENCOUNTER — Ambulatory Visit (INDEPENDENT_AMBULATORY_CARE_PROVIDER_SITE_OTHER): Payer: BC Managed Care – PPO | Admitting: Medical

## 2013-07-09 ENCOUNTER — Encounter: Payer: Self-pay | Admitting: Medical

## 2013-07-09 VITALS — BP 122/82 | HR 98 | Temp 98.0°F | Resp 16 | Wt 213.0 lb

## 2013-07-09 DIAGNOSIS — R10819 Abdominal tenderness, unspecified site: Secondary | ICD-10-CM

## 2013-07-09 DIAGNOSIS — R300 Dysuria: Secondary | ICD-10-CM

## 2013-07-09 LAB — POCT URINALYSIS DIPSTICK
Bilirubin, UA: NEGATIVE
Blood, UA: NEGATIVE
Glucose, UA: NEGATIVE
Nitrite, UA: NEGATIVE
Protein, UA: NEGATIVE
Spec Grav, UA: 1.015
Urobilinogen, UA: NEGATIVE
pH, UA: 5

## 2013-07-09 LAB — POCT URINE PREGNANCY: Preg Test, Ur: NEGATIVE

## 2013-07-09 MED ORDER — CIPROFLOXACIN HCL 250 MG PO TABS: 250.0000 mg | ORAL_TABLET | Freq: Two times a day (BID) | ORAL | Status: DC

## 2013-07-09 NOTE — Progress Notes (Signed)
Subjective:  Christina Payne is a 27 y.o. female who complains of possible urinary tract infection.  She has had symptoms for 2 days.  Symptoms include abdominal twinge behind belly button.  last time this happened had a UTI.  she did an OTC UTI kit saying she had UTI. Marland Kitchen  Has some burning at the urethra but not when urinating.  Patient denies back pain, fever, vaginal discharge and no blood in urine, no odor or cloudy urine.  Last UTI was 10 years ago.   Using increased hydration for current symptoms.  She did change soaps recently, changed birth control, and does drink a lot of caffeine.  Patient does not have a history of recurrent UTI. Patient does not have a history of pyelonephritis.  No other aggravating or relieving factors.  LMP 07/02/13.  No other c/o.  Past Medical History  Diagnosis Date  . GERD (gastroesophageal reflux disease)   . Allergic rhinitis   . Hypertension     hospitalized 11/01/10  . Wears glasses   . Abnormal Pap smear     Dr. Clearance Coots  . Obesity   . Contraception management     [facility], Dr. Clearance Coots  . Dysmenorrhea     on Implanon + OCP per Dr. Clearance Coots  . Pulmonary edema 12/11    postpartum, hospitalized;      ROS as in subjective  Reviewed allergies, medications, past medical, surgical, and social history.    Objective: Filed Vitals:   07/09/13 1541  BP: 122/82  Pulse: 98  Temp: 98 F (36.7 C)  Resp: 16    General appearance: alert, no distress, WD/WN, female Abdomen: +bs, soft, non tender, non distended, no masses, no hepatomegaly, no splenomegaly, no bruits Back: no CVA tenderness     Assessment: Encounter Diagnoses  Name Primary?  . Abdominal tenderness Yes  . Dysuria      Plan: Discussed symptoms.  UA doesn't suggest UTI, urine pregnancy negative.  symptoms could be related to change in soaps, caffeine.  At this point she will change back to her regular soap and cut back on caffeine.  She will increase water intake.   Offered  STD screening/wet prep given the urethra burning, but she declines.  She seems low risk for STD, married, monogamous, and she has not been on recent antibiotics.  If worse symptoms of UTI over the weekend as discussed she will begin Cipro.  Otherwise, call/return if worse or not improving.  Handout given.

## 2013-07-09 NOTE — Patient Instructions (Signed)
Urinary Tract Infection  Urinary tract infections (UTIs) can develop anywhere along your urinary tract. Your urinary tract is your body's drainage system for removing wastes and extra water. Your urinary tract includes two kidneys, two ureters, a bladder, and a urethra. Your kidneys are a pair of bean-shaped organs. Each kidney is about the size of your fist. They are located below your ribs, one on each side of your spine.  CAUSES  Infections are caused by microbes, which are microscopic organisms, including fungi, viruses, and bacteria. These organisms are so small that they can only be seen through a microscope. Bacteria are the microbes that most commonly cause UTIs.  SYMPTOMS   Symptoms of UTIs may vary by age and gender of the patient and by the location of the infection. Symptoms in young women typically include a frequent and intense urge to urinate and a painful, burning feeling in the bladder or urethra during urination. Older women and men are more likely to be tired, shaky, and weak and have muscle aches and abdominal pain. A fever may mean the infection is in your kidneys. Other symptoms of a kidney infection include pain in your back or sides below the ribs, nausea, and vomiting.  DIAGNOSIS  To diagnose a UTI, your caregiver will ask you about your symptoms. Your caregiver also will ask to provide a urine sample. The urine sample will be tested for bacteria and white blood cells. White blood cells are made by your body to help fight infection.  TREATMENT   Typically, UTIs can be treated with medication. Because most UTIs are caused by a bacterial infection, they usually can be treated with the use of antibiotics. The choice of antibiotic and length of treatment depend on your symptoms and the type of bacteria causing your infection.  HOME CARE INSTRUCTIONS   If you were prescribed antibiotics, take them exactly as your caregiver instructs you. Finish the medication even if you feel better after you  have only taken some of the medication.   Drink enough water and fluids to keep your urine clear or pale yellow.   Avoid caffeine, tea, and carbonated beverages. They tend to irritate your bladder.   Empty your bladder often. Avoid holding urine for long periods of time.   Empty your bladder before and after sexual intercourse.   After a bowel movement, women should cleanse from front to back. Use each tissue only once.  SEEK MEDICAL CARE IF:    You have back pain.   You develop a fever.   Your symptoms do not begin to resolve within 3 days.  SEEK IMMEDIATE MEDICAL CARE IF:    You have severe back pain or lower abdominal pain.   You develop chills.   You have nausea or vomiting.   You have continued burning or discomfort with urination.  MAKE SURE YOU:    Understand these instructions.   Will watch your condition.   Will get help right away if you are not doing well or get worse.  Document Released: 08/07/2005 Document Revised: 04/28/2012 Document Reviewed: 12/06/2011  [employer] Patient Information 2014 [employer].

## 2013-08-10 ENCOUNTER — Ambulatory Visit (INDEPENDENT_AMBULATORY_CARE_PROVIDER_SITE_OTHER): Payer: BC Managed Care – PPO | Admitting: Medical

## 2013-08-10 ENCOUNTER — Encounter: Payer: Self-pay | Admitting: Medical

## 2013-08-10 VITALS — BP 92/70 | HR 98 | Temp 98.2°F | Resp 16 | Wt 208.0 lb

## 2013-08-10 DIAGNOSIS — M25512 Pain in left shoulder: Secondary | ICD-10-CM

## 2013-08-10 DIAGNOSIS — M7520 Bicipital tendinitis, unspecified shoulder: Secondary | ICD-10-CM

## 2013-08-10 DIAGNOSIS — M7522 Bicipital tendinitis, left shoulder: Secondary | ICD-10-CM

## 2013-08-10 DIAGNOSIS — M25519 Pain in unspecified shoulder: Secondary | ICD-10-CM

## 2013-08-10 MED ORDER — NAPROXEN 500 MG PO TABS: 500.0000 mg | ORAL_TABLET | Freq: Two times a day (BID) | ORAL | Status: DC

## 2013-08-10 NOTE — Progress Notes (Signed)
Subjective: Here for pain.  Thinks she pulled a muscle. Started few days ago with neck pain radiating down left arm.  Has soreness in left shoulder like a tetanus shot.  Has stinging, burning pain.  Has pain going into armpit and left chest.   Started a few days ago while lying down.  Initially thought she laid on her neck wrong.  Tried to sleep it off.  Pain has persisted, constant.  Worse with arm motions overhead.  She works at [employer] at work as part of her job.   Objective: Gen: wd, wn, nad Skin: warm, no erythema or warmth Neck: nontender, normal ROM, no mass MSK: Tender over left biceps origin, tender over left a.c. joint mildly, tender over left supraspinatus, tender over the deltoid generalized , pain with left shoulder flexion over 90, pain and left shoulder abduction over 90, pain with resisted shoulder flexion and bicep flexion, otherwise upper extremity exam unremarkable  Pulses normal Neuro: normal strength and sensation of arms  Assessment: Encounter Diagnoses  Name Primary?  . Biceps tendonitis on left Yes  . Pain in joint, shoulder region, left    Plan: Discussed findings, diagnosis, usual course of treatment and time to resolve.   Advised rest of the shoulder, limit lifting and using the arm at work, use arm sling OTC for 1-2 hours at a time, ice/heat alternating, begin Naprosyn, and this should resolve with adequate time and rest.  Recheck if not improving in 1-2 wk.

## 2013-08-10 NOTE — Patient Instructions (Signed)
Exam and symptoms suggest biceps tendonitis, supraspinatus and shoulder girdle inflammation such as bursitis.    Begin Naprosyn 500mg  tablet twice daily with food for 5-7 days  For the next 3 days, use ice pack several times daily.  After 3 days, use alternating ice and heat.   Consider massage to neck and upper back.  REST.   Use arm sling for an hour or 2 at a time over the next few days.  Stretch the neck and shoulder.   If not improving in a week, let me know.

## 2014-01-14 ENCOUNTER — Encounter: Payer: Self-pay | Admitting: Medical

## 2014-01-14 ENCOUNTER — Ambulatory Visit (INDEPENDENT_AMBULATORY_CARE_PROVIDER_SITE_OTHER): Payer: BC Managed Care – PPO | Admitting: Medical

## 2014-01-14 VITALS — BP 110/70 | HR 88 | Temp 98.3°F | Resp 16 | Wt 214.0 lb

## 2014-01-14 DIAGNOSIS — H9190 Unspecified hearing loss, unspecified ear: Secondary | ICD-10-CM

## 2014-01-14 DIAGNOSIS — H9209 Otalgia, unspecified ear: Secondary | ICD-10-CM

## 2014-01-14 DIAGNOSIS — J3489 Other specified disorders of nose and nasal sinuses: Secondary | ICD-10-CM

## 2014-01-14 DIAGNOSIS — H9202 Otalgia, left ear: Secondary | ICD-10-CM

## 2014-01-14 NOTE — Progress Notes (Signed)
Subjective:   Christina Payne is a 28 y.o. female who presents with 1 mo hx/o left ear stays clogged up.  Yawning and popping doesn't seem to help.   At times seems like everythign is under water in the left ear.  Sometimes discomfort.  Has had some post nasal drainage, runny nose, sinus pressure, sore throat at times.  Using some Claritin, but this hasn't helped.  No ear drainage.  Doesn't feel sick, no sinus pressure, no fever.  No changes in sense of smell or taste, no significant history of sinusitis or otitis media.  No other aggravating or relieving factors.  No other c/o.  ROS as in subjective  Objective:  Filed Vitals:   01/14/14 0832  BP: 110/70  Pulse: 88  Temp: 98.3 F (36.8 C)  Resp: 16    General appearance: Alert, WD/WN, no distress                             Skin: warm, no rash                           Head: mild frontal and maxillary sinus tenderness                            Eyes: conjunctiva normal, corneas clear, PERRLA                            Ears: TMs pearly, external ear canals normal                          Nose: septum midline, turbinates swollen, pink but no discharge             Mouth/throat: MMM, tongue normal, mild pharyngeal erythema                           Neck: supple, no adenopathy, no thyromegaly, nontender                         Lungs: CTA bilaterally, no wheezes, rales, or rhonchi     Assessment: Encounter Diagnoses  Name Primary?  . Otalgia of left ear Yes  . Sinus pressure   . Hearing decreased      Plan: Discussed symptoms, exam findings, and will treat for serous otitis/nasal congestion.  Specific recommendations today include: Begin sample Nasonex 1 spray per nostril twice daily for 1 week, then once daily Continue Claritin OTC at bedtime If not improving by early next week, call back and I'll put you on antibiotic for possible sinus infection Call/return in 2-3 days if symptoms aren't resolving.

## 2014-01-14 NOTE — Patient Instructions (Signed)
  Thank you for giving me the opportunity to serve you today.    Your diagnosis today includes: Encounter Diagnoses  Name Primary?  . Otalgia of left ear Yes  . Sinus pressure   . Hearing decreased      Specific recommendations today include: Begin sample Nasonex 1 spray per nostril twice daily for 1 week, then once daily Continue Claritin OTC at bedtime If not improving by early next week, call back and I'll put you on antibiotic for possible sinus infection  Follow up: as needed   I have included other useful information below for your review.  Serous Otitis Media  Serous otitis media is fluid in the middle ear space. This space contains the bones for hearing and air. Air in the middle ear space helps to transmit sound.  The air gets there through the eustachian tube. This tube goes from the back of the nose (nasopharynx) to the middle ear space. It keeps the pressure in the middle ear the same as the outside world. It also helps to drain fluid from the middle ear space. CAUSES  Serous otitis media occurs when the eustachian tube gets blocked. Blockage can come from:  Ear infections.  Colds and other upper respiratory infections.  Allergies.  Irritants such as cigarette smoke.  Sudden changes in air pressure (such as descending in an airplane).  Enlarged adenoids.  A mass in the nasopharynx. During colds and upper respiratory infections, the middle ear space can become temporarily filled with fluid. This can happen after an ear infection also. Once the infection clears, the fluid will generally drain out of the ear through the eustachian tube. If it does not, then serous otitis media occurs. SIGNS AND SYMPTOMS   Hearing loss.  A feeling of fullness in the ear, without pain.  Young children may not show any symptoms but may show slight behavioral changes, such as agitation, ear pulling, or crying. DIAGNOSIS  Serous otitis media is diagnosed by an ear exam. Tests may  be done to check on the movement of the eardrum. Hearing exams may also be done. TREATMENT  The fluid most often goes away without treatment. If allergy is the cause, allergy treatment may be helpful. Fluid that persists for several months may require minor surgery. A small tube is placed in the eardrum to:  Drain the fluid.  Restore the air in the middle ear space. In certain situations, antibiotics are used to avoid surgery. Surgery may be done to remove enlarged adenoids (if this is the cause). HOME CARE INSTRUCTIONS   Keep children away from tobacco smoke.  Be sure to keep any follow-up appointments. SEEK MEDICAL CARE IF:   Your hearing is not better in 3 months.  Your hearing is worse.  You have ear pain.  You have drainage from the ear.  You have dizziness.  You have serous otitis media only in one ear or have any bleeding from your nose (epistaxis).  You notice a lump on your neck. MAKE SURE YOU:  Understand these instructions.   Will watch your condition.   Will get help right away if you are not doing well or get worse.  Document Released: 01/18/2004 Document Revised: 06/30/2013 Document Reviewed: 05/25/2013 [facility] Patient Information 2014 [place], [place].

## 2014-04-08 ENCOUNTER — Telehealth: Payer: Self-pay | Admitting: *Deleted

## 2014-04-08 DIAGNOSIS — Z3009 Encounter for other general counseling and advice on contraception: Secondary | ICD-10-CM

## 2014-04-08 MED ORDER — NORGESTIMATE-ETH ESTRADIOL 0.25-35 MG-MCG PO TABS: 1.0000 | ORAL_TABLET | Freq: Every day | ORAL | Status: DC

## 2014-04-08 NOTE — Telephone Encounter (Signed)
Patient called requesting a refill of her OCP. Patient has appointment 05/2014.  Attempt to call patient back- number not available. Will RF until next appointment.

## 2014-04-11 ENCOUNTER — Other Ambulatory Visit: Payer: Self-pay | Admitting: *Deleted

## 2014-05-25 ENCOUNTER — Ambulatory Visit (INDEPENDENT_AMBULATORY_CARE_PROVIDER_SITE_OTHER): Payer: No Typology Code available for payment source | Admitting: Obstetrics

## 2014-05-25 ENCOUNTER — Encounter: Payer: Self-pay | Admitting: Obstetrics

## 2014-05-25 VITALS — BP 117/80 | HR 97 | Temp 98.5°F | Ht 61.0 in | Wt 221.0 lb

## 2014-05-25 DIAGNOSIS — E669 Obesity, unspecified: Secondary | ICD-10-CM

## 2014-05-25 DIAGNOSIS — Z01419 Encounter for gynecological examination (general) (routine) without abnormal findings: Secondary | ICD-10-CM

## 2014-05-25 DIAGNOSIS — Z3009 Encounter for other general counseling and advice on contraception: Secondary | ICD-10-CM

## 2014-05-25 MED ORDER — NORGESTIMATE-ETH ESTRADIOL 0.25-35 MG-MCG PO TABS: 1.0000 | ORAL_TABLET | Freq: Every day | ORAL | Status: DC

## 2014-05-25 NOTE — Progress Notes (Signed)
Subjective:     Christina Payne is a 28 y.o. female here for a routine exam.  Current complaints: None.    Personal health questionnaire:  Is patient Ashkenazi Jewish, have a family history of breast and/or ovarian cancer: no Is there a family history of uterine cancer diagnosed at age < 53, gastrointestinal cancer, urinary tract cancer, family member who is a Personnel officer syndrome-associated carrier: no Is the patient overweight and hypertensive, family history of diabetes, personal history of gestational diabetes or PCOS: no Is patient over 75, have PCOS,  family history of premature CHD under age 78, diabetes, smoke, have hypertension or peripheral artery disease:  no  The HPI was reviewed and explored in further detail by the provider. Gynecologic History Patient's last menstrual period was 05/04/2014. Contraception: OCP (estrogen/progesterone) Last Pap: 2014. Results were: normal Last mammogram: n/a. Results were: n/a  Obstetric History OB History  Gravida Para Term Preterm AB SAB TAB Ectopic Multiple Living  3 2 2  1 1    2     # Outcome Date GA Lbr Len/2nd Weight Sex Delivery Anes PTL Lv  3 SAB           2 TRM      SVD EPI  Y  1 TRM      LTCS   Y      Past Medical History  Diagnosis Date  . GERD (gastroesophageal reflux disease)   . Allergic rhinitis   . Hypertension     hospitalized 11/01/10  . Wears glasses   . Abnormal Pap smear     Dr. Clearance Coots  . Obesity   . Contraception management     [facility], Dr. Clearance Coots  . Dysmenorrhea     on Implanon + OCP per Dr. Clearance Coots  . Pulmonary edema 12/11    postpartum, hospitalized;      Past Surgical History  Procedure Laterality Date  . Cesarean section  01/2008    Current outpatient prescriptions:loratadine (CLARITIN) 10 MG tablet, Take 10 mg by mouth daily., Disp: , Rfl: ;  Multiple Vitamin (MULTIVITAMIN WITH MINERALS) TABS, Take 1 tablet by mouth daily., Disp: , Rfl: ;  norgestimate-ethinyl estradiol  (ORTHO-CYCLEN,SPRINTEC,PREVIFEM) 0.25-35 MG-MCG tablet, Take 1 tablet by mouth daily., Disp: 1 Package, Rfl: 11 Acetaminophen (TYLENOL PO), Take 2 tablets by mouth every 4 (four) hours as needed. For pain, Disp: , Rfl: ;  [DISCONTINUED] omeprazole (PRILOSEC) 20 MG capsule, Take 20 mg by mouth daily.  , Disp: , Rfl:  No Known Allergies  History  Substance Use Topics  . Smoking status: Never Smoker   . Smokeless tobacco: Not on file  . Alcohol Use: 1.2 oz/week    2 Glasses of wine per week     Comment: occasional    Family History  Problem Relation Age of Onset  . Hypertension Mother   . Fibromyalgia Mother   . Asthma Mother   . Allergic rhinitis Mother   . Other Father     unknown  . Other Sister     died at birth, mother had eclampsia  . Allergic rhinitis Brother   . Cancer Paternal Grandmother     lung  . Diabetes Paternal Grandmother   . Heart disease Neg Hx   . Other Brother     auditory processing disorder, eating disorder, sleeping disorder      Review of Systems  Constitutional: negative for fatigue and weight loss Respiratory: negative for cough and wheezing Cardiovascular: negative for chest pain, fatigue and  palpitations Gastrointestinal: negative for abdominal pain and change in bowel habits Musculoskeletal:negative for myalgias Neurological: negative for gait problems and tremors Behavioral/Psych: negative for abusive relationship, depression Endocrine: negative for temperature intolerance   Genitourinary:negative for abnormal menstrual periods, genital lesions, hot flashes, sexual problems and vaginal discharge Integument/breast: negative for breast lump, breast tenderness, nipple discharge and skin lesion(s)    Objective:       BP 117/80  Pulse 97  Temp(Src) 98.5 F (36.9 C)  Ht 5\' 1"  (1.549 m)  Wt 221 lb (100.245 kg)  BMI 41.78 kg/m2  LMP 05/04/2014 General:   alert  Skin:   no rash or abnormalities  Lungs:   clear to auscultation bilaterally   Heart:   regular rate and rhythm, S1, S2 normal, no murmur, click, rub or gallop  Breasts:   normal without suspicious masses, skin or nipple changes or axillary nodes  Abdomen:  normal findings: no organomegaly, soft, non-tender and no hernia  Pelvis:  External genitalia: normal general appearance Urinary system: urethral meatus normal and bladder without fullness, nontender Vaginal: normal without tenderness, induration or masses Cervix: normal appearance Adnexa: normal bimanual exam Uterus: anteverted and non-tender, normal size   Lab Review Urine pregnancy test Labs reviewed yes Radiologic studies reviewed no    Assessment:    Healthy female exam.   OCP Surveillance.  Doing well.  Obesity   Plan:    Education reviewed: low fat, low cholesterol diet, self breast exams and weight bearing exercise. Contraception: OCP (estrogen/progesterone). Follow up in: 1 year.   Meds ordered this encounter  Medications  . norgestimate-ethinyl estradiol (ORTHO-CYCLEN,SPRINTEC,PREVIFEM) 0.25-35 MG-MCG tablet    Sig: Take 1 tablet by mouth daily.    Dispense:  1 Package    Refill:  11   No orders of the defined types were placed in this encounter.

## 2014-05-25 NOTE — Addendum Note (Signed)
Addended by: Marya LandryFOSTER, Albie Arizpe D on: 05/25/2014 05:01 PM   Modules accepted: Orders

## 2014-05-26 LAB — WET PREP BY MOLECULAR PROBE
Candida species: NEGATIVE
Gardnerella vaginalis: POSITIVE — AB
TRICHOMONAS VAG: NEGATIVE

## 2014-05-26 LAB — GC/CHLAMYDIA PROBE AMP
CT Probe RNA: NEGATIVE
GC Probe RNA: NEGATIVE

## 2014-05-27 LAB — PAP IG W/ RFLX HPV ASCU

## 2014-06-07 ENCOUNTER — Telehealth: Payer: Self-pay | Admitting: *Deleted

## 2014-06-07 NOTE — Telephone Encounter (Signed)
Patient states she missed her cycle for 1 week. 06/07/2014 1:30 LM on VM to CB.

## 2014-06-09 NOTE — Telephone Encounter (Signed)
Patient states she skipped her cycle-but has been taking her OCP as she should. Patient states she had negative home test and she did not show positive with her children until she was 4 months pregnant. Discussed the body response to her COP and patient is reassured. Patient to await her next cycle and if it does not start she is going to do another pregnancy test. If negative- probable response to OCP- but can do blood test if needed.

## 2014-06-09 NOTE — Telephone Encounter (Signed)
06/09/2014 10:01 Patient states she is late with her cycle. 2:37 Patient is in the office requesting to speak with someone.

## 2014-06-10 ENCOUNTER — Other Ambulatory Visit: Payer: Self-pay | Admitting: *Deleted

## 2014-06-10 DIAGNOSIS — B9689 Other specified bacterial agents as the cause of diseases classified elsewhere: Secondary | ICD-10-CM

## 2014-06-10 DIAGNOSIS — N760 Acute vaginitis: Principal | ICD-10-CM

## 2014-06-10 MED ORDER — METRONIDAZOLE 500 MG PO TABS: 500.0000 mg | ORAL_TABLET | Freq: Two times a day (BID) | ORAL | Status: DC

## 2014-06-10 NOTE — Progress Notes (Signed)
Pt made aware of lab results and Metronidazole 500mg  was sent to pharmacy.

## 2014-08-19 ENCOUNTER — Inpatient Hospital Stay (HOSPITAL_COMMUNITY): Payer: BC Managed Care – PPO

## 2014-08-19 ENCOUNTER — Inpatient Hospital Stay (HOSPITAL_COMMUNITY)
Admission: AD | Admit: 2014-08-19 | Discharge: 2014-08-19 | Disposition: A | Discharge status: Home / Self Care | Payer: BC Managed Care – PPO | Source: Ambulatory Visit | Attending: Obstetrics & Gynecology | Admitting: Obstetrics & Gynecology

## 2014-08-19 ENCOUNTER — Encounter (HOSPITAL_COMMUNITY): Payer: Self-pay | Admitting: *Deleted

## 2014-08-19 DIAGNOSIS — R109 Unspecified abdominal pain: Secondary | ICD-10-CM | POA: Insufficient documentation

## 2014-08-19 DIAGNOSIS — R1032 Left lower quadrant pain: Secondary | ICD-10-CM

## 2014-08-19 DIAGNOSIS — B9689 Other specified bacterial agents as the cause of diseases classified elsewhere: Secondary | ICD-10-CM

## 2014-08-19 DIAGNOSIS — N760 Acute vaginitis: Secondary | ICD-10-CM | POA: Insufficient documentation

## 2014-08-19 LAB — URINALYSIS, ROUTINE W REFLEX MICROSCOPIC
BILIRUBIN URINE: NEGATIVE
Glucose, UA: NEGATIVE mg/dL
Hgb urine dipstick: NEGATIVE
KETONES UR: NEGATIVE mg/dL
Leukocytes, UA: NEGATIVE
NITRITE: NEGATIVE
PROTEIN: NEGATIVE mg/dL
Specific Gravity, Urine: 1.015 (ref 1.005–1.030)
UROBILINOGEN UA: 0.2 mg/dL (ref 0.0–1.0)
pH: 6 (ref 5.0–8.0)

## 2014-08-19 LAB — WET PREP, GENITAL
TRICH WET PREP: NONE SEEN
YEAST WET PREP: NONE SEEN

## 2014-08-19 LAB — POCT PREGNANCY, URINE: PREG TEST UR: NEGATIVE

## 2014-08-19 LAB — HIV ANTIBODY (ROUTINE TESTING W REFLEX): HIV 1&2 Ab, 4th Generation: NONREACTIVE

## 2014-08-19 MED ORDER — METRONIDAZOLE 500 MG PO TABS: 500.0000 mg | ORAL_TABLET | Freq: Two times a day (BID) | ORAL | Status: DC

## 2014-08-19 NOTE — MAU Note (Signed)
Patient states she has been having left lower abdominal pain off and on for a couple of weeks, worse for the past few days. Denies bleeding, discharge, nausea or vomiting. Pain does not get relief from Tylenol.

## 2014-08-19 NOTE — Discharge Instructions (Signed)
Abdominal Pain, Women °Abdominal (stomach, pelvic, or belly) pain can be caused by many things. It is important to tell your doctor: °· The location of the pain. °· Does it come and go or is it present all the time? °· Are there things that start the pain (eating certain foods, exercise)? °· Are there other symptoms associated with the pain (fever, nausea, vomiting, diarrhea)? °All of this is helpful to know when trying to find the cause of the pain. °CAUSES  °· Stomach: virus or bacteria infection, or ulcer. °· Intestine: appendicitis (inflamed appendix), regional ileitis (Crohn's disease), ulcerative colitis (inflamed colon), irritable bowel syndrome, diverticulitis (inflamed diverticulum of the colon), or cancer of the stomach or intestine. °· Gallbladder disease or stones in the gallbladder. °· Kidney disease, kidney stones, or infection. °· Pancreas infection or cancer. °· Fibromyalgia (pain disorder). °· Diseases of the female organs: °· Uterus: fibroid (non-cancerous) tumors or infection. °· Fallopian tubes: infection or tubal pregnancy. °· Ovary: cysts or tumors. °· Pelvic adhesions (scar tissue). °· Endometriosis (uterus lining tissue growing in the pelvis and on the pelvic organs). °· Pelvic congestion syndrome (female organs filling up with blood just before the menstrual period). °· Pain with the menstrual period. °· Pain with ovulation (producing an egg). °· Pain with an IUD (intrauterine device, birth control) in the uterus. °· Cancer of the female organs. °· Functional pain (pain not caused by a disease, may improve without treatment). °· Psychological pain. °· Depression. °DIAGNOSIS  °Your doctor will decide the seriousness of your pain by doing an examination. °· Blood tests. °· X-rays. °· Ultrasound. °· CT scan (computed tomography, special type of X-ray). °· MRI (magnetic resonance imaging). °· Cultures, for infection. °· Barium enema (dye inserted in the large intestine, to better view it with  X-rays). °· Colonoscopy (looking in intestine with a lighted tube). °· Laparoscopy (minor surgery, looking in abdomen with a lighted tube). °· Major abdominal exploratory surgery (looking in abdomen with a large incision). °TREATMENT  °The treatment will depend on the cause of the pain.  °· Many cases can be observed and treated at home. °· Over-the-counter medicines recommended by your caregiver. °· Prescription medicine. °· Antibiotics, for infection. °· Birth control pills, for painful periods or for ovulation pain. °· Hormone treatment, for endometriosis. °· Nerve blocking injections. °· Physical therapy. °· Antidepressants. °· Counseling with a psychologist or psychiatrist. °· Minor or major surgery. °HOME CARE INSTRUCTIONS  °· Do not take laxatives, unless directed by your caregiver. °· Take over-the-counter pain medicine only if ordered by your caregiver. Do not take aspirin because it can cause an upset stomach or bleeding. °· Try a clear liquid diet (broth or water) as ordered by your caregiver. Slowly move to a bland diet, as tolerated, if the pain is related to the stomach or intestine. °· Have a thermometer and take your temperature several times a day, and record it. °· Bed rest and sleep, if it helps the pain. °· Avoid sexual intercourse, if it causes pain. °· Avoid stressful situations. °· Keep your follow-up appointments and tests, as your caregiver orders. °· If the pain does not go away with medicine or surgery, you may try: °· Acupuncture. °· Relaxation exercises (yoga, meditation). °· Group therapy. °· Counseling. °SEEK MEDICAL CARE IF:  °· You notice certain foods cause stomach pain. °· Your home care treatment is not helping your pain. °· You need stronger pain medicine. °· You want your IUD removed. °· You feel faint or   lightheaded. °· You develop nausea and vomiting. °· You develop a rash. °· You are having side effects or an allergy to your medicine. °SEEK IMMEDIATE MEDICAL CARE IF:  °· Your  pain does not go away or gets worse. °· You have a fever. °· Your pain is felt only in portions of the abdomen. The right side could possibly be appendicitis. The left lower portion of the abdomen could be colitis or diverticulitis. °· You are passing blood in your stools (bright red or black tarry stools, with or without vomiting). °· You have blood in your urine. °· You develop chills, with or without a fever. °· You pass out. °MAKE SURE YOU:  °· Understand these instructions. °· Will watch your condition. °· Will get help right away if you are not doing well or get worse. °Document Released: 08/25/2007 Document Revised: 03/14/2014 Document Reviewed: 09/14/2009 °ExitCare® Patient Information ©2015 [employer]. This information is not intended to replace advice given to you by your health care provider. Make sure you discuss any questions you have with your health care provider. °Bacterial Vaginosis °Bacterial vaginosis is a vaginal infection that occurs when the normal balance of bacteria in the vagina is disrupted. It results from an overgrowth of certain bacteria. This is the most common vaginal infection in women of childbearing age. Treatment is important to prevent complications, especially in pregnant women, as it can cause a premature delivery. °CAUSES  °Bacterial vaginosis is caused by an increase in harmful bacteria that are normally present in smaller amounts in the vagina. Several different kinds of bacteria can cause bacterial vaginosis. However, the reason that the condition develops is not fully understood. °RISK FACTORS °Certain activities or behaviors can put you at an increased risk of developing bacterial vaginosis, including: °· Having a new sex partner or multiple sex partners. °· Douching. °· Using an intrauterine device (IUD) for contraception. °Women do not get bacterial vaginosis from toilet seats, bedding, swimming pools, or contact with objects around them. °SIGNS AND SYMPTOMS  °Some  women with bacterial vaginosis have no signs or symptoms. Common symptoms include: °· Grey vaginal discharge. °· A fishlike odor with discharge, especially after sexual intercourse. °· Itching or burning of the vagina and vulva. °· Burning or pain with urination. °DIAGNOSIS  °Your health care provider will take a medical history and examine the vagina for signs of bacterial vaginosis. A sample of vaginal fluid may be taken. Your health care provider will look at this sample under a microscope to check for bacteria and abnormal cells. A vaginal pH test may also be done.  °TREATMENT  °Bacterial vaginosis may be treated with antibiotic medicines. These may be given in the form of a pill or a vaginal cream. A second round of antibiotics may be prescribed if the condition comes back after treatment.  °HOME CARE INSTRUCTIONS  °· Only take over-the-counter or prescription medicines as directed by your health care provider. °· If antibiotic medicine was prescribed, take it as directed. Make sure you finish it even if you start to feel better. °· Do not have sex until treatment is completed. °· Tell all sexual partners that you have a vaginal infection. They should see their health care provider and be treated if they have problems, such as a mild rash or itching. °· Practice safe sex by using condoms and only having one sex partner. °SEEK MEDICAL CARE IF:  °· Your symptoms are not improving after 3 days of treatment. °· You have increased discharge   or pain. °· You have a fever. °MAKE SURE YOU:  °· Understand these instructions. °· Will watch your condition. °· Will get help right away if you are not doing well or get worse. °FOR MORE INFORMATION  °Centers for Disease Control and Prevention, Division of STD Prevention: www.cdc.gov/std °American Sexual Health Association (ASHA): www.ashastd.org  °Document Released: 10/28/2005 Document Revised: 08/18/2013 Document Reviewed: 06/09/2013 °ExitCare® Patient Information ©2015  [employer]. This information is not intended to replace advice given to you by your health care provider. Make sure you discuss any questions you have with your health care provider. ° °

## 2014-08-19 NOTE — MAU Provider Note (Signed)
History     CSN: [id number]  Arrival date and time: 08/19/14 1138   First Provider Initiated Contact with Patient 08/19/14 1422      Chief Complaint  Patient presents with  . Abdominal Pain   HPI Christina Payne 28 y.o. [id number] nonpregnant female presents to MAU complaining of abdominal pain and a bubble in her pelvic area.  She had a 5 day period about a month ago.  This past week, she has been having cramping that made her think her period was returning.   A few days ago she noticed a pulling sensation in her groin and a bubble-like sensation in her L lower abdomen that is hurting.  This is 8/10 and worse with touching the area.   She is concerned she could have uterine cancer as her mom was diagnosed with that one year ago.  She is also concerned about pregnancy and STD's.  OB History   Grav Para Term Preterm Abortions TAB SAB Ect Mult Living   3 2 2  1  1   2       Past Medical History  Diagnosis Date  . GERD (gastroesophageal reflux disease)   . Allergic rhinitis   . Hypertension     hospitalized 11/01/10  . Wears glasses   . Abnormal Pap smear     Dr. Clearance CootsHarper  . Obesity   . Contraception management     [facility], Dr. Clearance CootsHarper  . Dysmenorrhea     on Implanon + OCP per Dr. Clearance CootsHarper  . Pulmonary edema 12/11    postpartum, hospitalized;      Past Surgical History  Procedure Laterality Date  . Cesarean section  01/2008    Family History  Problem Relation Age of Onset  . Hypertension Mother   . Fibromyalgia Mother   . Asthma Mother   . Allergic rhinitis Mother   . Other Father     unknown  . Other Sister     died at birth, mother had eclampsia  . Allergic rhinitis Brother   . Cancer Paternal Grandmother     lung  . Diabetes Paternal Grandmother   . Heart disease Neg Hx   . Other Brother     auditory processing disorder, eating disorder, sleeping disorder    History  Substance Use Topics  . Smoking status: Never Smoker   . Smokeless tobacco:  Never Used  . Alcohol Use: 1.2 oz/week    2 Glasses of wine per week     Comment: occasional    Allergies: No Known Allergies  Prescriptions prior to admission  Medication Sig Dispense Refill  . Acetaminophen (TYLENOL PO) Take 2 tablets by mouth every 4 (four) hours as needed. For pain      . loratadine (CLARITIN) 10 MG tablet Take 10 mg by mouth daily.      . metroNIDAZOLE (FLAGYL) 500 MG tablet Take 1 tablet (500 mg total) by mouth 2 (two) times daily. For 7 days  14 tablet  0  . Multiple Vitamin (MULTIVITAMIN WITH MINERALS) TABS Take 1 tablet by mouth daily.      . norgestimate-ethinyl estradiol (ORTHO-CYCLEN,SPRINTEC,PREVIFEM) 0.25-35 MG-MCG tablet Take 1 tablet by mouth daily.  1 Package  11    Review of Systems  Constitutional: Negative for fever, chills and diaphoresis.  HENT: Positive for congestion. Negative for sore throat.   Eyes: Negative for blurred vision and double vision.  Respiratory: Negative for cough, shortness of breath and wheezing.   Cardiovascular: Negative for  chest pain and palpitations.  Gastrointestinal: Positive for abdominal pain. Negative for heartburn, nausea, vomiting, diarrhea and constipation.  Genitourinary: Negative for dysuria, frequency and hematuria.  Musculoskeletal: Positive for back pain. Negative for neck pain.  Skin: Negative for itching and rash.  Neurological: Negative for dizziness, tingling, weakness and headaches.  Psychiatric/Behavioral: Negative for depression, suicidal ideas and substance abuse. The patient is not nervous/anxious.    Physical Exam   Blood pressure 133/83, pulse 86, temperature 98.9 F (37.2 C), temperature source Oral, resp. rate 18, height 5\' 1"  (1.549 m), weight 216 lb (97.977 kg), last menstrual period 07/20/2014, SpO2 100.00%.  Physical Exam  Constitutional: She is oriented to person, place, and time. She appears well-developed and well-nourished. No distress.  HENT:  Head: Normocephalic and atraumatic.   Eyes: EOM are normal.  Neck: Normal range of motion.  Cardiovascular: Normal rate and normal heart sounds.   Respiratory: Effort normal and breath sounds normal. No respiratory distress.  GI: Soft. Bowel sounds are normal. She exhibits no distension. There is tenderness. There is no rebound and no guarding.  Significant tenderness in LLQ - very low.   NO rebound tenderness  Genitourinary: Vagina normal.  Small amt of clear discharge in vagina.   Cervix with moderate amt of clear mucus in os.   No CMT.  Significant tenderness in bilat adnexa.  No mass palpated.    Musculoskeletal: Normal range of motion.  Neurological: She is alert and oriented to person, place, and time.  Skin: Skin is warm and dry.  Psychiatric: She has a normal mood and affect.   Results for orders placed during the hospital encounter of 08/19/14 (from the past 24 hour(s))  URINALYSIS, ROUTINE W REFLEX MICROSCOPIC     Status: None   Collection Time    08/19/14  1:02 PM      Result Value Ref Range   Color, Urine YELLOW  YELLOW   APPearance CLEAR  CLEAR   Specific Gravity, Urine 1.015  1.005 - 1.030   pH 6.0  5.0 - 8.0   Glucose, UA NEGATIVE  NEGATIVE mg/dL   Hgb urine dipstick NEGATIVE  NEGATIVE   Bilirubin Urine NEGATIVE  NEGATIVE   Ketones, ur NEGATIVE  NEGATIVE mg/dL   Protein, ur NEGATIVE  NEGATIVE mg/dL   Urobilinogen, UA 0.2  0.0 - 1.0 mg/dL   Nitrite NEGATIVE  NEGATIVE   Leukocytes, UA NEGATIVE  NEGATIVE  POCT PREGNANCY, URINE     Status: None   Collection Time    08/19/14  1:09 PM      Result Value Ref Range   Preg Test, Ur NEGATIVE  NEGATIVE  WET PREP, GENITAL     Status: Abnormal   Collection Time    08/19/14  2:51 PM      Result Value Ref Range   Yeast Wet Prep HPF POC NONE SEEN  NONE SEEN   Trich, Wet Prep NONE SEEN  NONE SEEN   Clue Cells Wet Prep HPF POC MODERATE (*) NONE SEEN   WBC, Wet Prep HPF POC MODERATE (*) NONE SEEN   [place] Transvaginal Non-ob  08/19/2014   CLINICAL DATA:   Left-sided pelvic region pain  EXAM: TRANSABDOMINAL AND TRANSVAGINAL ULTRASOUND OF PELVIS  TECHNIQUE: Study was performed transabdominally to optimize pelvic field of view evaluation and transvaginally to optimize internal visceral architecture evaluation.  COMPARISON:  None  FINDINGS: Uterus  Measurements: 7.6 x 4.9 x 6.3 cm. No fibroids or other mass visualized. Uterus is anteverted.  Endometrium  Thickness: 5 mm.  No focal abnormality visualized.  Right ovary  Measurements: 3.6 x 1.9 x 1.6 cm. Normal appearance/no adnexal mass.  Left ovary  Measurements: 3.2 x 2.2 x 2.4 cm. Normal appearance/no adnexal mass.  Other findings  No free fluid.  IMPRESSION: Study within normal limits.   Electronically Signed   By: Bretta Bang M.D.   On: 08/19/2014 15:53   US Pelvis Complete  08/19/2014   CLINICAL DATA:  Left-sided pelvic region pain  EXAM: TRANSABDOMINAL AND TRANSVAGINAL ULTRASOUND OF PELVIS  TECHNIQUE: Study was performed transabdominally to optimize pelvic field of view evaluation and transvaginally to optimize internal visceral architecture evaluation.  COMPARISON:  None  FINDINGS: Uterus  Measurements: 7.6 x 4.9 x 6.3 cm. No fibroids or other mass visualized. Uterus is anteverted.  Endometrium  Thickness: 5 mm.  No focal abnormality visualized.  Right ovary  Measurements: 3.6 x 1.9 x 1.6 cm. Normal appearance/no adnexal mass.  Left ovary  Measurements: 3.2 x 2.2 x 2.4 cm. Normal appearance/no adnexal mass.  Other findings  No free fluid.  IMPRESSION: Study within normal limits.   Electronically Signed   By: Bretta Bang M.D.   On: 08/19/2014 15:53    MAU Course  Procedures  MDM Wet prep with mod clues and mod WBC.  No pathology identified on ultrasound  Assessment and Plan  A: Bacterial vaginosis, abdominal pain  P: Discharge to home OTC Ibuprofen for discomfort Flagyl x 1 week No etoh/IC x 10 days See office if menses does not return/to restart OCP Return to MAU for  emergency   Bertram Denver 08/19/2014, 2:27 PM

## 2014-08-20 LAB — GC/CHLAMYDIA PROBE AMP
CT PROBE, AMP APTIMA: NEGATIVE
GC PROBE AMP APTIMA: NEGATIVE

## 2014-09-12 ENCOUNTER — Encounter (HOSPITAL_COMMUNITY): Payer: Self-pay | Admitting: *Deleted

## 2015-05-30 ENCOUNTER — Ambulatory Visit: Payer: No Typology Code available for payment source | Admitting: Obstetrics

## 2015-05-31 ENCOUNTER — Ambulatory Visit: Payer: No Typology Code available for payment source | Admitting: Obstetrics

## 2015-06-15 ENCOUNTER — Ambulatory Visit: Payer: No Typology Code available for payment source | Admitting: Medical

## 2015-06-17 ENCOUNTER — Encounter (HOSPITAL_COMMUNITY): Payer: Self-pay

## 2015-06-17 ENCOUNTER — Inpatient Hospital Stay (HOSPITAL_COMMUNITY)
Admission: AD | Admit: 2015-06-17 | Discharge: 2015-06-17 | Disposition: A | Discharge status: Home / Self Care | Payer: Managed Care, Other (non HMO) | Source: Ambulatory Visit | Attending: Obstetrics & Gynecology | Admitting: Obstetrics & Gynecology

## 2015-06-17 ENCOUNTER — Inpatient Hospital Stay (HOSPITAL_COMMUNITY): Payer: Managed Care, Other (non HMO)

## 2015-06-17 DIAGNOSIS — O418X10 Other specified disorders of amniotic fluid and membranes, first trimester, not applicable or unspecified: Secondary | ICD-10-CM

## 2015-06-17 DIAGNOSIS — R109 Unspecified abdominal pain: Secondary | ICD-10-CM | POA: Diagnosis present

## 2015-06-17 DIAGNOSIS — O209 Hemorrhage in early pregnancy, unspecified: Secondary | ICD-10-CM

## 2015-06-17 DIAGNOSIS — O468X1 Other antepartum hemorrhage, first trimester: Secondary | ICD-10-CM

## 2015-06-17 DIAGNOSIS — O208 Other hemorrhage in early pregnancy: Secondary | ICD-10-CM | POA: Diagnosis not present

## 2015-06-17 DIAGNOSIS — Z3A01 Less than 8 weeks gestation of pregnancy: Secondary | ICD-10-CM | POA: Diagnosis not present

## 2015-06-17 LAB — HCG, QUANTITATIVE, PREGNANCY: hCG, Beta Chain, Quant, S: 84070 m[IU]/mL — ABNORMAL HIGH (ref <5)

## 2015-06-17 LAB — WET PREP, GENITAL
TRICH WET PREP: NONE SEEN
Yeast Wet Prep HPF POC: NONE SEEN

## 2015-06-17 LAB — CBC
HEMATOCRIT: 36.9 % (ref 36.0–46.0)
HEMOGLOBIN: 12.4 g/dL (ref 12.0–15.0)
MCH: 30.4 pg (ref 26.0–34.0)
MCHC: 33.6 g/dL (ref 30.0–36.0)
MCV: 90.4 fL (ref 78.0–100.0)
PLATELETS: 230 10*3/uL (ref 150–400)
RBC: 4.08 MIL/uL (ref 3.87–5.11)
RDW: 12.1 % (ref 11.5–15.5)
WBC: 10.5 10*3/uL (ref 4.0–10.5)

## 2015-06-17 LAB — URINALYSIS, ROUTINE W REFLEX MICROSCOPIC
Bilirubin Urine: NEGATIVE
Glucose, UA: NEGATIVE mg/dL
Hgb urine dipstick: NEGATIVE
Ketones, ur: 15 mg/dL — AB
Leukocytes, UA: NEGATIVE
Nitrite: NEGATIVE
PH: 7.5 (ref 5.0–8.0)
PROTEIN: NEGATIVE mg/dL
Specific Gravity, Urine: 1.025 (ref 1.005–1.030)
UROBILINOGEN UA: 0.2 mg/dL (ref 0.0–1.0)

## 2015-06-17 LAB — POCT PREGNANCY, URINE: PREG TEST UR: POSITIVE — AB

## 2015-06-17 NOTE — MAU Provider Note (Signed)
History     CSN: [id number]  Arrival date and time: 06/17/15 8295   First Provider Initiated Contact with Patient 06/17/15 2052      Chief Complaint  Patient presents with  . Vaginal Bleeding  . Abdominal Cramping   HPI   Ms.Caleigh Rabelo is a 29 y.o. female [phone] at [redacted]w[redacted]d presenting to MAU with abdominal pain and vaginal spotting.  The vaginal spotting started a few hours ago; she laid down to take a nap and when she got up to use the bathroom she noticed very light pink discharge. 2 hours ago she noticed it again. No recent intercourse.  The Abdominal pain: Started a few hours ago; she felt bloated and crampy. She currently rates her pain 3/10 "just feels uncomfortable". The pain is located in there lower abdomen, worse on the Left side. She has not taken anything for the pain.   OB History    Gravida Para Term Preterm AB TAB SAB Ectopic Multiple Living   4 2 2  1  1   2       Past Medical History  Diagnosis Date  . GERD (gastroesophageal reflux disease)   . Allergic rhinitis   . Hypertension     hospitalized 11/01/10  . Wears glasses   . Abnormal Pap smear     Dr. Clearance Coots  . Obesity   . Contraception management     [facility], Dr. Clearance Coots  . Dysmenorrhea     on Implanon + OCP per Dr. Clearance Coots  . Pulmonary edema 12/11    postpartum, hospitalized;      Past Surgical History  Procedure Laterality Date  . Cesarean section  01/2008    Family History  Problem Relation Age of Onset  . Hypertension Mother   . Fibromyalgia Mother   . Asthma Mother   . Allergic rhinitis Mother   . Other Father     unknown  . Other Sister     died at birth, mother had eclampsia  . Allergic rhinitis Brother   . Cancer Paternal Grandmother     lung  . Diabetes Paternal Grandmother   . Heart disease Neg Hx   . Other Brother     auditory processing disorder, eating disorder, sleeping disorder    History  Substance Use Topics  . Smoking status: Never Smoker   .  Smokeless tobacco: Never Used  . Alcohol Use: 1.2 oz/week    2 Glasses of wine per week     Comment: occasional    Allergies: No Known Allergies  Prescriptions prior to admission  Medication Sig Dispense Refill Last Dose  . acetaminophen (TYLENOL) 500 MG tablet Take 1,000 mg by mouth every 6 (six) hours as needed for mild pain.   two weeks at two weeks  . loratadine (CLARITIN) 10 MG tablet Take 10 mg by mouth daily as needed for allergies.    Past Week at Unknown time  . Multiple Vitamin (MULTIVITAMIN WITH MINERALS) TABS Take 1 tablet by mouth daily.   Past Week at Unknown time  . ibuprofen (ADVIL,MOTRIN) 200 MG tablet Take 600-800 mg by mouth every 6 (six) hours as needed for moderate pain (depends on pain how much patient takes).   prn at prn  . metroNIDAZOLE (FLAGYL) 500 MG tablet Take 1 tablet (500 mg total) by mouth 2 (two) times daily. (Patient not taking: Reported on 06/17/2015) 14 tablet 0 Not Taking at Unknown time   Results for orders placed or performed during the hospital  encounter of 06/17/15 (from the past 48 hour(s))  Urinalysis, Routine w reflex microscopic (not at [facility])     Status: Abnormal   Collection Time: 06/17/15  8:10 PM  Result Value Ref Range   Color, Urine YELLOW YELLOW   APPearance CLEAR CLEAR   Specific Gravity, Urine 1.025 1.005 - 1.030   pH 7.5 5.0 - 8.0   Glucose, UA NEGATIVE NEGATIVE mg/dL   Hgb urine dipstick NEGATIVE NEGATIVE   Bilirubin Urine NEGATIVE NEGATIVE   Ketones, ur 15 (A) NEGATIVE mg/dL   Protein, ur NEGATIVE NEGATIVE mg/dL   Urobilinogen, UA 0.2 0.0 - 1.0 mg/dL   Nitrite NEGATIVE NEGATIVE   Leukocytes, UA NEGATIVE NEGATIVE    Comment: MICROSCOPIC NOT DONE ON URINES WITH NEGATIVE PROTEIN, BLOOD, LEUKOCYTES, NITRITE, OR GLUCOSE <1000 mg/dL.  Pregnancy, urine POC     Status: Abnormal   Collection Time: 06/17/15  8:16 PM  Result Value Ref Range   Preg Test, Ur POSITIVE (A) NEGATIVE    Comment:        THE SENSITIVITY OF THIS METHODOLOGY IS  >24 mIU/mL   Wet prep, genital     Status: Abnormal   Collection Time: 06/17/15  8:54 PM  Result Value Ref Range   Yeast Wet Prep HPF POC NONE SEEN NONE SEEN   Trich, Wet Prep NONE SEEN NONE SEEN   Clue Cells Wet Prep HPF POC FEW (A) NONE SEEN   WBC, Wet Prep HPF POC MODERATE (A) NONE SEEN    Comment: MANY BACTERIA SEEN  CBC     Status: None   Collection Time: 06/17/15  9:10 PM  Result Value Ref Range   WBC 10.5 4.0 - 10.5 K/uL   RBC 4.08 3.87 - 5.11 MIL/uL   Hemoglobin 12.4 12.0 - 15.0 g/dL   HCT 16.1 09.6 - 04.5 %   MCV 90.4 78.0 - 100.0 fL   MCH 30.4 26.0 - 34.0 pg   MCHC 33.6 30.0 - 36.0 g/dL   RDW 40.9 81.1 - 91.4 %   Platelets 230 150 - 400 K/uL  ABO/Rh     Status: None (Preliminary result)   Collection Time: 06/17/15  9:10 PM  Result Value Ref Range   ABO/RH(D) O POS   hCG, quantitative, pregnancy     Status: Abnormal   Collection Time: 06/17/15  9:10 PM  Result Value Ref Range   hCG, Beta Chain, Quant, S 84070 (H) <5 mIU/mL    Comment:          GEST. AGE      CONC.  (mIU/mL)   <=1 WEEK        5 - 50     2 WEEKS       50 - 500     3 WEEKS       100 - 10,000     4 WEEKS     1,000 - 30,000     5 WEEKS     3,500 - 115,000   6-8 WEEKS     12,000 - 270,000    12 WEEKS     15,000 - 220,000        FEMALE AND NON-PREGNANT FEMALE:     LESS THAN 5 mIU/mL    US Ob Comp Less 14 Wks  06/17/2015   CLINICAL DATA:  Acute onset of pelvic cramping and vaginal spotting. Initial encounter.  EXAM: OBSTETRIC <14 WK [place] AND TRANSVAGINAL OB US  TECHNIQUE: Both transabdominal and transvaginal ultrasound examinations were performed for  complete evaluation of the gestation as well as the maternal uterus, adnexal regions, and pelvic cul-de-sac. Transvaginal technique was performed to assess early pregnancy.  COMPARISON:  Pelvic ultrasound performed 08/19/2014  FINDINGS: Intrauterine gestational sac: Visualized/normal in shape.  Yolk sac:  Yes  Embryo:  Yes  Cardiac Activity: Yes  Heart Rate: 121   bpm  CRL:  7 mm   6 w   5 d                  [place] EDC: 02/05/2016  Maternal uterus/adnexae: A small amount of subchorionic hemorrhage is noted. The uterus is otherwise unremarkable.  The ovaries are within normal limits. The right ovary measures 3.4 x 1.8 x 2.1 cm, while the left ovary measures 2.6 x 1.5 0.6 cm. No suspicious adnexal masses are seen; there is no evidence for ovarian torsion.  No free fluid is seen within the pelvic cul-de-sac.  IMPRESSION: 1. Single live intrauterine pregnancy noted, with a crown-rump length of 7 mm, corresponding to a gestational age of [redacted] weeks 5 days. This matches the gestational age of [redacted] weeks 0 days by LMP, reflecting an estimated date of delivery of February 03, 2016. 2. Small amount of subchorionic hemorrhage noted.   Electronically Signed   By: Roanna Raider M.D.   On: 06/17/2015 22:25   US Ob Transvaginal  06/17/2015   CLINICAL DATA:  Acute onset of pelvic cramping and vaginal spotting. Initial encounter.  EXAM: OBSTETRIC <14 WK [place] AND TRANSVAGINAL OB US  TECHNIQUE: Both transabdominal and transvaginal ultrasound examinations were performed for complete evaluation of the gestation as well as the maternal uterus, adnexal regions, and pelvic cul-de-sac. Transvaginal technique was performed to assess early pregnancy.  COMPARISON:  Pelvic ultrasound performed 08/19/2014  FINDINGS: Intrauterine gestational sac: Visualized/normal in shape.  Yolk sac:  Yes  Embryo:  Yes  Cardiac Activity: Yes  Heart Rate: 121  bpm  CRL:  7 mm   6 w   5 d                  [place] EDC: 02/05/2016  Maternal uterus/adnexae: A small amount of subchorionic hemorrhage is noted. The uterus is otherwise unremarkable.  The ovaries are within normal limits. The right ovary measures 3.4 x 1.8 x 2.1 cm, while the left ovary measures 2.6 x 1.5 0.6 cm. No suspicious adnexal masses are seen; there is no evidence for ovarian torsion.  No free fluid is seen within the pelvic cul-de-sac.  IMPRESSION: 1. Single live  intrauterine pregnancy noted, with a crown-rump length of 7 mm, corresponding to a gestational age of [redacted] weeks 5 days. This matches the gestational age of [redacted] weeks 0 days by LMP, reflecting an estimated date of delivery of February 03, 2016. 2. Small amount of subchorionic hemorrhage noted.   Electronically Signed   By: Roanna Raider M.D.   On: 06/17/2015 22:25    Review of Systems  Constitutional: Negative for fever.  Gastrointestinal: Positive for nausea, vomiting, abdominal pain and constipation.   Physical Exam   Blood pressure 139/80, pulse 89, temperature 98.1 F (36.7 C), resp. rate 18, height 5\' 1"  (1.549 m), weight 106.414 kg (234 lb 9.6 oz), last menstrual period 04/29/2015.  Physical Exam  Constitutional: She is oriented to person, place, and time. She appears well-developed and well-nourished. No distress.  HENT:  Head: Normocephalic.  Eyes: Pupils are equal, round, and reactive to light.  Respiratory: Effort normal.  GI: Soft. She exhibits no  distension and no mass. There is no tenderness. There is no rebound and no guarding.  Genitourinary:  Speculum exam: Vagina - Small amount of creamy discharge, no odor Cervix - + contact bleeding, small amount  Bimanual exam: Cervix closed, no CMT  Uterus non tender, normal size Adnexa non tender, no masses bilaterally GC/Chlam, wet prep done Chaperone present for exam.  Musculoskeletal: Normal range of motion.  Neurological: She is alert and oriented to person, place, and time.  Skin: Skin is warm. She is not diaphoretic.  Psychiatric: Her behavior is normal.    MAU Course  Procedures  None  MDM  O positive blood type  Assessment and Plan   A:  1. Subchorionic hemorrhage, first trimester   2. Vaginal bleeding in pregnancy, first trimester    P:  Discharge home in stable condition Pelvic rest Bleeding precautions Return to MAU if symptoms worsen  Follow up with OB as scheduled    Duane Lope,  NP 06/17/2015 8:54 PM

## 2015-06-17 NOTE — MAU Note (Signed)
Spotting and cramping today. Constipated also so unsure if that is causing cramping. HAd BM today

## 2015-06-17 NOTE — Discharge Instructions (Signed)

## 2015-06-18 LAB — HIV ANTIBODY (ROUTINE TESTING W REFLEX): HIV SCREEN 4TH GENERATION: NONREACTIVE

## 2015-06-18 LAB — ABO/RH: ABO/RH(D): O POS

## 2015-06-19 LAB — GC/CHLAMYDIA PROBE AMP (~~LOC~~) NOT AT ARMC
CHLAMYDIA, DNA PROBE: NEGATIVE
Neisseria Gonorrhea: NEGATIVE

## 2015-06-20 LAB — OB RESULTS CONSOLE GC/CHLAMYDIA: Gonorrhea: NEGATIVE

## 2015-06-20 LAB — OB RESULTS CONSOLE RPR: RPR: NONREACTIVE

## 2015-06-20 LAB — OB RESULTS CONSOLE RUBELLA ANTIBODY, IGM: Rubella: IMMUNE

## 2015-06-20 LAB — OB RESULTS CONSOLE HEPATITIS B SURFACE ANTIGEN: HEP B S AG: NEGATIVE

## 2015-07-15 ENCOUNTER — Emergency Department (INDEPENDENT_AMBULATORY_CARE_PROVIDER_SITE_OTHER)
Admission: EM | Admit: 2015-07-15 | Discharge: 2015-07-15 | Disposition: A | Discharge status: Home / Self Care | Payer: Managed Care, Other (non HMO) | Source: Home / Self Care | Attending: Family Medicine | Admitting: Family Medicine

## 2015-07-15 ENCOUNTER — Encounter (HOSPITAL_COMMUNITY): Payer: Self-pay | Admitting: Emergency Medicine

## 2015-07-15 ENCOUNTER — Emergency Department (INDEPENDENT_AMBULATORY_CARE_PROVIDER_SITE_OTHER): Payer: Managed Care, Other (non HMO)

## 2015-07-15 DIAGNOSIS — S82891A Other fracture of right lower leg, initial encounter for closed fracture: Secondary | ICD-10-CM | POA: Diagnosis not present

## 2015-07-15 NOTE — Discharge Instructions (Signed)
You have a small fracture of your ankle. Please call Dr. Audrie Lia office on Tuesday for follow-up appointment sometime next week. Please use the cam walker boot as discussed. Best of luck in the future and with your pregnancy.

## 2015-07-15 NOTE — ED Provider Notes (Signed)
CSN: [id number]     Arrival date & time 07/15/15  1909 History   First MD Initiated Contact with Patient 07/15/15 1917     Chief Complaint  Patient presents with  . Ankle Pain   (Consider location/radiation/quality/duration/timing/severity/associated sxs/prior Treatment) HPI  Right ankle injury. Patient states that she is walking today 3 field when her foot went into a hole and she rolled it. Occurred around 12:00. Does not recall any specific pop or crunch. Patient wrapped her ankle and went to work. As her day progressed ankle became progressively more tender. Now states she is unable to angulate on it. Of note patient is [redacted] weeks pregnant. Patient denies any vaginal bleeding, contractions.  Sensation and movement intact. Denies any LOC, head trauma, abdominal trauma.    Past Medical History  Diagnosis Date  . GERD (gastroesophageal reflux disease)   . Allergic rhinitis   . Hypertension     hospitalized 11/01/10  . Wears glasses   . Abnormal Pap smear     Dr. Clearance Coots  . Obesity   . Contraception management     [facility], Dr. Clearance Coots  . Dysmenorrhea     on Implanon + OCP per Dr. Clearance Coots  . Pulmonary edema 12/11    postpartum, hospitalized;     Past Surgical History  Procedure Laterality Date  . Cesarean section  01/2008   Family History  Problem Relation Age of Onset  . Hypertension Mother   . Fibromyalgia Mother   . Asthma Mother   . Allergic rhinitis Mother   . Other Father     unknown  . Other Sister     died at birth, mother had eclampsia  . Allergic rhinitis Brother   . Cancer Paternal Grandmother     lung  . Diabetes Paternal Grandmother   . Heart disease Neg Hx   . Other Brother     auditory processing disorder, eating disorder, sleeping disorder   Social History  Substance Use Topics  . Smoking status: Never Smoker   . Smokeless tobacco: Never Used  . Alcohol Use: 1.2 oz/week    2 Glasses of wine per week     Comment: occasional   OB  History    Gravida Para Term Preterm AB TAB SAB Ectopic Multiple Living   4 2 2  1  1   2      Review of Systems Per HPI with all other pertinent systems negative.   Allergies  Review of patient's allergies indicates no known allergies.  Home Medications   Prior to Admission medications   Medication Sig Start Date End Date Taking? Authorizing Provider  ondansetron (ZOFRAN-ODT) 4 MG disintegrating tablet Take 4 mg by mouth every 8 (eight) hours as needed for nausea or vomiting.   Yes Historical Provider, MD  Prenatal Vit-Fe Fumarate-FA (PRENATAL MULTIVITAMIN) TABS tablet Take 1 tablet by mouth daily at 12 noon.   Yes Historical Provider, MD  acetaminophen (TYLENOL) 500 MG tablet Take 1,000 mg by mouth every 6 (six) hours as needed for mild pain.    Historical Provider, MD  loratadine (CLARITIN) 10 MG tablet Take 10 mg by mouth daily as needed for allergies.     Historical Provider, MD  metroNIDAZOLE (FLAGYL) 500 MG tablet Take 1 tablet (500 mg total) by mouth 2 (two) times daily. Patient not taking: Reported on 06/17/2015 08/19/14   Bertram Denver, PA-C  Multiple Vitamin (MULTIVITAMIN WITH MINERALS) TABS Take 1 tablet by mouth daily.  Historical Provider, MD   Meds Ordered and Administered this Visit  Medications - No data to display  BP 126/78 mmHg  Pulse 120  Temp(Src) 99.9 F (37.7 C) (Oral)  Resp 18  SpO2 97%  LMP 04/29/2015 No data found.   Physical Exam Physical Exam  Constitutional: oriented to person, place, and time. appears well-developed and well-nourished. No distress.  HENT:  Head: Normocephalic and atraumatic.  Eyes: EOMI. PERRL.  Neck: Normal range of motion.  Cardiovascular: RRR, no m/r/g, 2+ distal pulses,  Pulmonary/Chest: Effort normal and breath sounds normal. No respiratory distress.  Abdominal: Soft. Bowel sounds are normal. NonTTP, no distension.  Musculoskeletal: Right ankle for range of motion, no appreciable swelling, lateral medial malleoli  or tenderness to palpation. 2+ pedal pulses..  Neurological: alert and oriented to person, place, and time.  Skin: Skin is warm. No rash noted. non diaphoretic.  Psychiatric: normal mood and affect. behavior is normal. Judgment and thought content normal.   ED Course  Procedures (including critical care time)  Labs Review Labs Reviewed - No data to display  Imaging Review Dg Ankle Complete Right  07/15/2015   CLINICAL DATA:  Fall with right ankle injury today and right ankle pain. Initial encounter.  EXAM: RIGHT ANKLE - COMPLETE 3+ VIEW  COMPARISON:  None.  FINDINGS: A small bony density at the fibular tip is compatible with age indeterminate avulsion fracture. Mild lateral soft tissue swelling is noted.  No other fracture, subluxation or dislocation identified. The joint spaces are otherwise unremarkable.  A small calcaneal spur is present.  IMPRESSION: Small bony density at the fibular tip compatible with an avulsion fracture of uncertain chronicity -correlate clinically.  Small calcaneal spur.   Electronically Signed   By: Harmon Pier M.D.   On: 07/15/2015 20:23     Visual Acuity Review  Right Eye Distance:   Left Eye Distance:   Bilateral Distance:    Right Eye Near:   Left Eye Near:    Bilateral Near:         MDM   1. Ankle fracture, right, closed, initial encounter    Fracture noted as above. Cam Walker boot. Follow-up with Dr. Lajoyce Corners at [facility] orthopedics. Tylenol only for pain.  Patient is [redacted] weeks pregnant and is not showing any signs of miscarriage. Continue prenatal vitamin. Follow up with OB/GYN as indicated.     Ozella Rocks, MD 07/15/15 2051

## 2015-07-15 NOTE — ED Notes (Signed)
The patient presented to the [facility] with a complaint of right ankle pain. The patient stated that she was walking and stepped off the side of the sidewalk and fell. She stated that her right foot went into a hole and caused her to fall. She stated that the incident occurred today.

## 2015-11-22 ENCOUNTER — Encounter: Payer: Managed Care, Other (non HMO) | Attending: Obstetrics & Gynecology

## 2015-11-22 VITALS — Ht 65.0 in | Wt 232.2 lb

## 2015-11-22 DIAGNOSIS — Z713 Dietary counseling and surveillance: Secondary | ICD-10-CM | POA: Insufficient documentation

## 2015-11-22 DIAGNOSIS — O24419 Gestational diabetes mellitus in pregnancy, unspecified control: Secondary | ICD-10-CM | POA: Diagnosis present

## 2015-11-23 NOTE — Progress Notes (Signed)
  Patient was seen on 11/22/15 for Gestational Diabetes self-management . The following learning objectives were met by the patient :   States the definition of Gestational Diabetes  States why dietary management is important in controlling blood glucose  Describes the effects of carbohydrates on blood glucose levels  Demonstrates ability to create a balanced meal plan  Demonstrates carbohydrate counting   States when to check blood glucose levels  Demonstrates proper blood glucose monitoring techniques  States the effect of stress and exercise on blood glucose levels  States the importance of limiting caffeine and abstaining from alcohol and smoking  Plan:  Aim for 2 Carb Choices per meal (30 grams) +/- 1 either way for breakfast Aim for 3 Carb Choices per meal (45 grams) +/- 1 either way from lunch and dinner Aim for 1-2 Carbs per snack Begin reading food labels for Total Carbohydrate and sugar grams of foods Consider  increasing your activity level by walking daily as tolerated Begin checking BG before breakfast and 2 hours after first bit of breakfast, lunch and dinner after  as directed by MD  Take medication  as directed by MD  Blood glucose monitor given:already testing per MD  Patient instructed to monitor glucose levels: FBS: 60 - <90 2 hour: <120  Patient received the following handouts:  Nutrition Diabetes and Pregnancy  Carbohydrate Counting List  Meal Planning worksheet  Patient will be seen for follow-up as needed.

## 2016-01-09 LAB — OB RESULTS CONSOLE GBS: GBS: NEGATIVE

## 2016-01-29 ENCOUNTER — Inpatient Hospital Stay (HOSPITAL_COMMUNITY): Payer: Managed Care, Other (non HMO) | Admitting: Anesthesiology

## 2016-01-29 ENCOUNTER — Encounter (HOSPITAL_COMMUNITY): Payer: Self-pay | Admitting: *Deleted

## 2016-01-29 ENCOUNTER — Inpatient Hospital Stay (HOSPITAL_COMMUNITY)
Admission: AD | Admit: 2016-01-29 | Discharge: 2016-02-01 | DRG: 765 | Disposition: A | Discharge status: Home / Self Care | Payer: Managed Care, Other (non HMO) | Source: Ambulatory Visit | Attending: Obstetrics & Gynecology | Admitting: Obstetrics & Gynecology

## 2016-01-29 DIAGNOSIS — O34211 Maternal care for low transverse scar from previous cesarean delivery: Secondary | ICD-10-CM | POA: Diagnosis present

## 2016-01-29 DIAGNOSIS — O99214 Obesity complicating childbirth: Secondary | ICD-10-CM | POA: Diagnosis present

## 2016-01-29 DIAGNOSIS — Z3A39 39 weeks gestation of pregnancy: Secondary | ICD-10-CM | POA: Diagnosis not present

## 2016-01-29 DIAGNOSIS — O99824 Streptococcus B carrier state complicating childbirth: Secondary | ICD-10-CM | POA: Diagnosis present

## 2016-01-29 DIAGNOSIS — Z6841 Body Mass Index (BMI) 40.0 and over, adult: Secondary | ICD-10-CM | POA: Diagnosis not present

## 2016-01-29 DIAGNOSIS — IMO0002 Reserved for concepts with insufficient information to code with codable children: Secondary | ICD-10-CM | POA: Diagnosis present

## 2016-01-29 DIAGNOSIS — O24420 Gestational diabetes mellitus in childbirth, diet controlled: Principal | ICD-10-CM | POA: Diagnosis present

## 2016-01-29 LAB — CBC
HCT: 37.5 % (ref 36.0–46.0)
Hemoglobin: 12.9 g/dL (ref 12.0–15.0)
MCH: 30.5 pg (ref 26.0–34.0)
MCHC: 34.4 g/dL (ref 30.0–36.0)
MCV: 88.7 fL (ref 78.0–100.0)
PLATELETS: 202 10*3/uL (ref 150–400)
RBC: 4.23 MIL/uL (ref 3.87–5.11)
RDW: 13.1 % (ref 11.5–15.5)
WBC: 7.4 10*3/uL (ref 4.0–10.5)

## 2016-01-29 LAB — GLUCOSE, RANDOM: GLUCOSE: 84 mg/dL (ref 65–99)

## 2016-01-29 LAB — TYPE AND SCREEN
ABO/RH(D): O POS
Antibody Screen: NEGATIVE

## 2016-01-29 MED ORDER — DIPHENHYDRAMINE HCL 50 MG/ML IJ SOLN: 12.5000 mg | INTRAMUSCULAR | Status: DC | PRN

## 2016-01-29 MED ORDER — FENTANYL 2.5 MCG/ML BUPIVACAINE 1/10 % EPIDURAL INFUSION (WH - ANES)
14.0000 mL/h | INTRAMUSCULAR | Status: DC | PRN
  Administered 2016-01-29: 14 mL/h via EPIDURAL
  Filled 2016-01-29: qty 125

## 2016-01-29 MED ORDER — LIDOCAINE HCL (PF) 1 % IJ SOLN
30.0000 mL | INTRAMUSCULAR | Status: DC | PRN
Start: 2016-01-29 — End: 2016-01-30

## 2016-01-29 MED ORDER — PENICILLIN G POTASSIUM 5000000 UNITS IJ SOLR
2.5000 10*6.[IU] | INTRAMUSCULAR | Status: DC
  Administered 2016-01-29 (×2): 2.5 10*6.[IU] via INTRAVENOUS
  Filled 2016-01-29 (×6): qty 2.5

## 2016-01-29 MED ORDER — PHENYLEPHRINE 40 MCG/ML (10ML) SYRINGE FOR IV PUSH (FOR BLOOD PRESSURE SUPPORT)
80.0000 ug | PREFILLED_SYRINGE | INTRAVENOUS | Status: DC | PRN
  Filled 2016-01-29: qty 20

## 2016-01-29 MED ORDER — LACTATED RINGERS IV SOLN: 500.0000 mL | INTRAVENOUS | Status: DC | PRN

## 2016-01-29 MED ORDER — PENICILLIN G POTASSIUM 5000000 UNITS IJ SOLR
5.0000 10*6.[IU] | Freq: Once | INTRAVENOUS | Status: DC
  Filled 2016-01-29: qty 5

## 2016-01-29 MED ORDER — OXYTOCIN 10 UNIT/ML IJ SOLN: 2.5000 [IU]/h | INTRAVENOUS | Status: DC

## 2016-01-29 MED ORDER — ONDANSETRON HCL 4 MG/2ML IJ SOLN
4.0000 mg | Freq: Four times a day (QID) | INTRAMUSCULAR | Status: DC | PRN
  Administered 2016-01-30: 4 mg via INTRAVENOUS

## 2016-01-29 MED ORDER — TERBUTALINE SULFATE 1 MG/ML IJ SOLN: 0.2500 mg | Freq: Once | INTRAMUSCULAR | Status: DC | PRN

## 2016-01-29 MED ORDER — BUTORPHANOL TARTRATE 1 MG/ML IJ SOLN
1.0000 mg | INTRAMUSCULAR | Status: DC | PRN
  Administered 2016-01-29: 1 mg via INTRAVENOUS
  Filled 2016-01-29: qty 1

## 2016-01-29 MED ORDER — LACTATED RINGERS IV SOLN
500.0000 mL | Freq: Once | INTRAVENOUS | Status: AC
  Administered 2016-01-29: 22:00:00 via INTRAVENOUS

## 2016-01-29 MED ORDER — LACTATED RINGERS IV SOLN
INTRAVENOUS | Status: DC
  Administered 2016-01-29 – 2016-01-30 (×2): via INTRAVENOUS

## 2016-01-29 MED ORDER — PHENYLEPHRINE 40 MCG/ML (10ML) SYRINGE FOR IV PUSH (FOR BLOOD PRESSURE SUPPORT)
80.0000 ug | PREFILLED_SYRINGE | INTRAVENOUS | Status: DC | PRN
Start: 2016-01-29 — End: 2016-01-30

## 2016-01-29 MED ORDER — ACETAMINOPHEN 325 MG PO TABS: 650.0000 mg | ORAL_TABLET | ORAL | Status: DC | PRN

## 2016-01-29 MED ORDER — LACTATED RINGERS IV SOLN
1.0000 m[IU]/min | INTRAVENOUS | Status: DC
  Administered 2016-01-29: 2 m[IU]/min via INTRAVENOUS
  Administered 2016-01-29: 4 m[IU]/min via INTRAVENOUS
  Administered 2016-01-29: 5 m[IU]/min via INTRAVENOUS
  Administered 2016-01-29: 3 m[IU]/min via INTRAVENOUS
  Administered 2016-01-29: 6 m[IU]/min via INTRAVENOUS
  Administered 2016-01-29: 1 m[IU]/min via INTRAVENOUS
  Filled 2016-01-29: qty 10

## 2016-01-29 MED ORDER — EPHEDRINE 5 MG/ML INJ: 10.0000 mg | INTRAVENOUS | Status: DC | PRN

## 2016-01-29 MED ORDER — LIDOCAINE HCL (PF) 1 % IJ SOLN
INTRAMUSCULAR | Status: DC | PRN
  Administered 2016-01-29: 7 mL via EPIDURAL
  Administered 2016-01-29: 5 mL via EPIDURAL

## 2016-01-29 MED ORDER — OXYTOCIN BOLUS FROM INFUSION: 500.0000 mL | INTRAVENOUS | Status: DC

## 2016-01-29 MED ORDER — CITRIC ACID-SODIUM CITRATE 334-500 MG/5ML PO SOLN
30.0000 mL | ORAL | Status: DC | PRN
  Administered 2016-01-30: 30 mL via ORAL
  Filled 2016-01-29: qty 15

## 2016-01-29 NOTE — Progress Notes (Signed)
OB PN:  S: Pt resting comfortably, no acute complaints  O: BP 131/78 mmHg  Pulse 87  Temp(Src) 97.9 F (36.6 C) (Oral)  Resp 16  Ht 5\' 1"  (1.549 m)  Wt 106.142 kg (234 lb)  BMI 44.24 kg/m2  LMP 04/29/2015  FHT: 140bpm, moderate variablity, + accels, rare variable decel to 120bpm with spontaneous recovery Toco: q4-185min SVE: 4/60/-3, AROM clear (bag previously thought broken, but was not)  A/P: 30 y.o. [id number] @ 8459w2d for IOL for GDMA1 1. FWB: Cat. II- overall FHT reassuring, pt repositioned, will continue to closely monitor 2. Labor: continue Pit per protocol, IUPC placed Pain: pain management upon request GBS: positive, continue PCN per protocol GDMA1-on arrival 84, no further accuchecks while in labor  Myna HidalgoJennifer Francina Beery, DO [phone] (pager) [phone] (office)

## 2016-01-29 NOTE — Progress Notes (Signed)
OB PN:  S: Pt resting comfortably, no acute complaints  O: BP 126/85 mmHg  Pulse 108  Temp(Src) 98.7 F (37.1 C) (Oral)  Resp 20  Ht 5\' 1"  (1.549 m)  Wt 106.142 kg (234 lb)  BMI 44.24 kg/m2  LMP 04/29/2015  FHT: 145bpm, moderate variablity, + accels, no decels Toco: q4-195min SVE: 2-3/50/-3, AROM scan pink  A/P: 30 y.o. [id number] @ 5743w2d for IOL for GDMA1 1. FWB: Cat. I 2. Labor: continue with Pit per protocol, consider IUPC later this evening if slow cervical change to ensure adequate contractions Pain: upon request GBS: positive, continue PCN per protocol GDMA1- 2hr PP- 84, no further accuchecks while in labor   Myna HidalgoJennifer Geraldean Walen, DO [phone] (pager) [phone] (office)

## 2016-01-29 NOTE — Anesthesia Preprocedure Evaluation (Addendum)
Anesthesia Evaluation  Patient identified by MRN, date of birth, ID band Patient awake    Reviewed: Allergy & Precautions, H&P , NPO status , Patient's Chart, lab work & pertinent test results  Airway Mallampati: II  TM Distance: >3 FB Neck ROM: full    Dental no notable dental hx.    Pulmonary neg pulmonary ROS,    Pulmonary exam normal        Cardiovascular hypertension, Normal cardiovascular exam     Neuro/Psych negative neurological ROS  negative psych ROS   GI/Hepatic Neg liver ROS,   Endo/Other  Morbid obesity  Renal/GU negative Renal ROS     Musculoskeletal   Abdominal (+) + obese,   Peds  Hematology negative hematology ROS (+)   Anesthesia Other Findings   Reproductive/Obstetrics (+) Pregnancy                            Anesthesia Physical Anesthesia Plan  ASA: III  Anesthesia Plan: Epidural   Post-op Pain Management:    Induction:   Airway Management Planned:   Additional Equipment:   Intra-op Plan:   Post-operative Plan:   Informed Consent: I have reviewed the patients History and Physical, chart, labs and discussed the procedure including the risks, benefits and alternatives for the proposed anesthesia with the patient or authorized representative who has indicated his/her understanding and acceptance.     Plan Discussed with: CRNA and Surgeon  Anesthesia Plan Comments: (For C-section @ [phone] with epidural.)       Anesthesia Quick Evaluation

## 2016-01-29 NOTE — H&P (Signed)
HPI: 30 y/o [id number] @ 7845w2d estimated gestational age (as dated by LMP c/w 20 week ultrasound) presents for IOL due to GDMA1.   no Leaking of Fluid,   no Vaginal Bleeding,   no Uterine Contractions,  + Fetal Movement.  ROS: no HA, no epigastric pain, no visual changes.    Pregnancy complicated by: 1) TOLAC- Pt has had one successful VBAC and desires to proceed with repeat TOLAC.   2) GDMA1- well controlled with diet, followed by NST weekly -concern for LGA, last US @ 10968w2d- vertex/posterior/7@14oz  (88%) 3) Obesity: BMI: 44 4) h/o postpartum preeclampsia- pt on ASA until 36wk, BP remain within normal limits    Prenatal Transfer Tool  Maternal Diabetes: Yes:  Diabetes Type:  Diet controlled Genetic Screening: Normal Maternal Ultrasounds/Referrals: Normal Fetal Ultrasounds or other Referrals:  None Maternal Substance Abuse:  No Significant Maternal Medications:  None Significant Maternal Lab Results: Lab values include: Group B Strep positive   PNL:  GBS positive, Rub Immune, Hep B neg, RPR NR, HIV neg, GC/C neg, glucola:abnormal Hgb: 11.8 Blood type: O positive  Immunizations: Tdap given 12/12/15, declined flu  OBHx:  2009- Primary C-section- 7#9oz, face presentation 2011- VBAC- 6#7oz PMHx:  none Meds:  PNV Allergy:  No Known Allergies SurgHx: C-section x 1 SocHx:   no Tobacco, n  EtOH, ono Illicit Drugs  O: Temp(Src) 98.6 F (37 C) (Oral)  Resp 20  Ht 5\' 1"  (1.549 m)  Wt 106.142 kg (234 lb)  BMI 44.24 kg/m2  LMP 04/29/2015  Gen. AAOx3, NAD CV.  RRR    Resp. Normal respirations and effort Abd. Gravid,  no tenderness,  no rigidity,  no guarding Extr.  no edema B/L , no calf tenderness  FHT: 145 baseline, moderate variability, + accels,  no decels Toco: irregular SVE: 1/25/-3, soft, posterior    Labs: see orders  A/P:  30 y.o. [id number] @ 1845w2d EGA who presents for IOL due to GDMA1 -FWB:  NICHD Cat I FHTs -Labor: plan for Pit per protocol -TOLAC form reviewed  and consent obtained -GBS: positive, PCN per protocol -Pain management: epidural upon request  Myna HidalgoJennifer Zemira Zehring, DO [phone] (pager) [phone] (office)

## 2016-01-29 NOTE — Anesthesia Procedure Notes (Signed)
Epidural Patient location during procedure: OB Start time: 01/29/2016 10:10 PM End time: 01/29/2016 10:14 PM  Staffing Anesthesiologist: Leilani AbleHATCHETT, Avilyn Virtue Performed by: anesthesiologist   Preanesthetic Checklist Completed: patient identified, surgical consent, pre-op evaluation, timeout performed, IV checked, risks and benefits discussed and monitors and equipment checked  Epidural Patient position: sitting Prep: site prepped and draped and DuraPrep Patient monitoring: continuous pulse ox and blood pressure Approach: midline Location: L3-L4 Injection technique: LOR air  Needle:  Needle type: Tuohy  Needle gauge: 17 G Needle length: 9 cm and 9 Needle insertion depth: 7 cm Catheter type: closed end flexible Catheter size: 19 Gauge Catheter at skin depth: 12 cm Test dose: negative and Other  Assessment Sensory level: T9 Events: blood not aspirated, injection not painful, no injection resistance, negative IV test and no paresthesia  Additional Notes Reason for block:procedure for pain

## 2016-01-30 ENCOUNTER — Telehealth: Payer: Self-pay | Admitting: Medical

## 2016-01-30 ENCOUNTER — Encounter (HOSPITAL_COMMUNITY)
Admission: AD | Disposition: A | Discharge status: Home / Self Care | Payer: Self-pay | Source: Ambulatory Visit | Attending: Obstetrics & Gynecology

## 2016-01-30 ENCOUNTER — Encounter (HOSPITAL_COMMUNITY): Payer: Self-pay | Admitting: Certified Nurse Midwife

## 2016-01-30 LAB — GLUCOSE, CAPILLARY: Glucose-Capillary: 91 mg/dL (ref 65–99)

## 2016-01-30 LAB — RPR: RPR: NONREACTIVE

## 2016-01-30 SURGERY — Surgical Case
Anesthesia: Epidural

## 2016-01-30 MED ORDER — ONDANSETRON HCL 4 MG/2ML IJ SOLN
INTRAMUSCULAR | Status: AC
  Filled 2016-01-30: qty 4

## 2016-01-30 MED ORDER — LIDOCAINE-EPINEPHRINE (PF) 2 %-1:200000 IJ SOLN
INTRAMUSCULAR | Status: AC
  Filled 2016-01-30: qty 20

## 2016-01-30 MED ORDER — PHENYLEPHRINE HCL 10 MG/ML IJ SOLN
INTRAMUSCULAR | Status: DC | PRN
  Administered 2016-01-30: 40 ug via INTRAVENOUS
  Administered 2016-01-30 (×2): 80 ug via INTRAVENOUS

## 2016-01-30 MED ORDER — ACETAMINOPHEN 500 MG PO TABS
1000.0000 mg | ORAL_TABLET | Freq: Four times a day (QID) | ORAL | Status: AC
  Administered 2016-01-30 (×3): 1000 mg via ORAL
  Filled 2016-01-30 (×3): qty 2

## 2016-01-30 MED ORDER — HYDROMORPHONE HCL 1 MG/ML IJ SOLN
INTRAMUSCULAR | Status: AC
  Administered 2016-01-30: 0.5 mg via INTRAVENOUS
  Filled 2016-01-30: qty 1

## 2016-01-30 MED ORDER — SODIUM CHLORIDE 0.9% FLUSH: 3.0000 mL | INTRAVENOUS | Status: DC | PRN

## 2016-01-30 MED ORDER — SCOPOLAMINE 1 MG/3DAYS TD PT72
MEDICATED_PATCH | TRANSDERMAL | Status: DC | PRN
  Administered 2016-01-30: 1 via TRANSDERMAL

## 2016-01-30 MED ORDER — DIPHENHYDRAMINE HCL 50 MG/ML IJ SOLN: 12.5000 mg | INTRAMUSCULAR | Status: DC | PRN

## 2016-01-30 MED ORDER — CEFAZOLIN SODIUM-DEXTROSE 2-3 GM-% IV SOLR
2.0000 g | Freq: Once | INTRAVENOUS | Status: AC
  Administered 2016-01-30: 2 g via INTRAVENOUS

## 2016-01-30 MED ORDER — OXYTOCIN 10 UNIT/ML IJ SOLN
40.0000 [IU] | INTRAMUSCULAR | Status: DC | PRN
  Administered 2016-01-30: 40 [IU] via INTRAVENOUS

## 2016-01-30 MED ORDER — LACTATED RINGERS IV SOLN
INTRAVENOUS | Status: DC
  Administered 2016-01-30: 15:00:00 via INTRAVENOUS

## 2016-01-30 MED ORDER — IBUPROFEN 600 MG PO TABS
600.0000 mg | ORAL_TABLET | Freq: Four times a day (QID) | ORAL | Status: DC
  Administered 2016-01-30 – 2016-02-01 (×9): 600 mg via ORAL
  Filled 2016-01-30 (×9): qty 1

## 2016-01-30 MED ORDER — OXYCODONE HCL 5 MG PO TABS
5.0000 mg | ORAL_TABLET | ORAL | Status: DC | PRN
  Administered 2016-01-31 – 2016-02-01 (×2): 5 mg via ORAL
  Filled 2016-01-30 (×2): qty 1

## 2016-01-30 MED ORDER — DIPHENHYDRAMINE HCL 25 MG PO CAPS: 25.0000 mg | ORAL_CAPSULE | Freq: Four times a day (QID) | ORAL | Status: DC | PRN

## 2016-01-30 MED ORDER — OXYTOCIN 10 UNIT/ML IJ SOLN
INTRAMUSCULAR | Status: AC
  Filled 2016-01-30: qty 4

## 2016-01-30 MED ORDER — SIMETHICONE 80 MG PO CHEW
80.0000 mg | CHEWABLE_TABLET | Freq: Three times a day (TID) | ORAL | Status: DC
  Administered 2016-01-30 – 2016-02-01 (×7): 80 mg via ORAL
  Filled 2016-01-30 (×7): qty 1

## 2016-01-30 MED ORDER — NALBUPHINE HCL 10 MG/ML IJ SOLN: 5.0000 mg | Freq: Once | INTRAMUSCULAR | Status: DC | PRN

## 2016-01-30 MED ORDER — SENNOSIDES-DOCUSATE SODIUM 8.6-50 MG PO TABS
2.0000 | ORAL_TABLET | ORAL | Status: DC
  Administered 2016-01-30 – 2016-02-01 (×2): 2 via ORAL
  Filled 2016-01-30 (×2): qty 2

## 2016-01-30 MED ORDER — KETOROLAC TROMETHAMINE 30 MG/ML IJ SOLN: 30.0000 mg | Freq: Once | INTRAMUSCULAR | Status: DC

## 2016-01-30 MED ORDER — OXYTOCIN 10 UNIT/ML IJ SOLN: 2.5000 [IU]/h | INTRAVENOUS | Status: AC

## 2016-01-30 MED ORDER — IBUPROFEN 600 MG PO TABS: 600.0000 mg | ORAL_TABLET | Freq: Four times a day (QID) | ORAL | Status: DC | PRN

## 2016-01-30 MED ORDER — SIMETHICONE 80 MG PO CHEW
80.0000 mg | CHEWABLE_TABLET | ORAL | Status: DC
  Administered 2016-01-30 – 2016-02-01 (×2): 80 mg via ORAL
  Filled 2016-01-30 (×2): qty 1

## 2016-01-30 MED ORDER — CEFAZOLIN SODIUM-DEXTROSE 2-3 GM-% IV SOLR
INTRAVENOUS | Status: AC
  Filled 2016-01-30: qty 50

## 2016-01-30 MED ORDER — KETOROLAC TROMETHAMINE 30 MG/ML IJ SOLN: 30.0000 mg | Freq: Four times a day (QID) | INTRAMUSCULAR | Status: AC | PRN

## 2016-01-30 MED ORDER — HYDROMORPHONE HCL 1 MG/ML IJ SOLN
0.2500 mg | INTRAMUSCULAR | Status: DC | PRN
Start: 2016-01-30 — End: 2016-01-30
  Administered 2016-01-30: 0.5 mg via INTRAVENOUS

## 2016-01-30 MED ORDER — MORPHINE SULFATE (PF) 0.5 MG/ML IJ SOLN
INTRAMUSCULAR | Status: AC
  Filled 2016-01-30: qty 10

## 2016-01-30 MED ORDER — SCOPOLAMINE 1 MG/3DAYS TD PT72
MEDICATED_PATCH | TRANSDERMAL | Status: AC
  Filled 2016-01-30: qty 1

## 2016-01-30 MED ORDER — SCOPOLAMINE 1 MG/3DAYS TD PT72
1.0000 | MEDICATED_PATCH | Freq: Once | TRANSDERMAL | Status: DC
  Filled 2016-01-30: qty 1

## 2016-01-30 MED ORDER — OXYCODONE HCL 5 MG PO TABS: 10.0000 mg | ORAL_TABLET | ORAL | Status: DC | PRN

## 2016-01-30 MED ORDER — ZOLPIDEM TARTRATE 5 MG PO TABS: 5.0000 mg | ORAL_TABLET | Freq: Every evening | ORAL | Status: DC | PRN

## 2016-01-30 MED ORDER — WITCH HAZEL-GLYCERIN EX PADS: 1.0000 "application " | MEDICATED_PAD | CUTANEOUS | Status: DC | PRN

## 2016-01-30 MED ORDER — DEXAMETHASONE SODIUM PHOSPHATE 4 MG/ML IJ SOLN
INTRAMUSCULAR | Status: AC
  Filled 2016-01-30: qty 1

## 2016-01-30 MED ORDER — DEXTROSE 5 % IV SOLN
1.0000 ug/kg/h | INTRAVENOUS | Status: DC | PRN
  Filled 2016-01-30: qty 2

## 2016-01-30 MED ORDER — MEPERIDINE HCL 25 MG/ML IJ SOLN: 6.2500 mg | INTRAMUSCULAR | Status: DC | PRN

## 2016-01-30 MED ORDER — NALBUPHINE HCL 10 MG/ML IJ SOLN: 5.0000 mg | INTRAMUSCULAR | Status: DC | PRN

## 2016-01-30 MED ORDER — SODIUM CHLORIDE 0.9 % IR SOLN
Status: DC | PRN
  Administered 2016-01-30: 1000 mL

## 2016-01-30 MED ORDER — PHENYLEPHRINE 8 MG IN D5W 100 ML (0.08MG/ML) PREMIX OPTIME: INJECTION | INTRAVENOUS | Status: DC | PRN

## 2016-01-30 MED ORDER — MISOPROSTOL 200 MCG PO TABS
ORAL_TABLET | ORAL | Status: AC
  Filled 2016-01-30: qty 1

## 2016-01-30 MED ORDER — SODIUM BICARBONATE 8.4 % IV SOLN
INTRAVENOUS | Status: DC | PRN
  Administered 2016-01-30 (×3): 5 mL via EPIDURAL

## 2016-01-30 MED ORDER — NALOXONE HCL 0.4 MG/ML IJ SOLN: 0.4000 mg | INTRAMUSCULAR | Status: DC | PRN

## 2016-01-30 MED ORDER — ONDANSETRON HCL 4 MG/2ML IJ SOLN: 4.0000 mg | Freq: Three times a day (TID) | INTRAMUSCULAR | Status: DC | PRN

## 2016-01-30 MED ORDER — PROMETHAZINE HCL 25 MG/ML IJ SOLN
6.2500 mg | INTRAMUSCULAR | Status: DC | PRN
Start: 2016-01-30 — End: 2016-01-30

## 2016-01-30 MED ORDER — DIBUCAINE 1 % RE OINT: 1.0000 "application " | TOPICAL_OINTMENT | RECTAL | Status: DC | PRN

## 2016-01-30 MED ORDER — DIPHENHYDRAMINE HCL 25 MG PO CAPS: 25.0000 mg | ORAL_CAPSULE | ORAL | Status: DC | PRN

## 2016-01-30 MED ORDER — MISOPROSTOL 200 MCG PO TABS
ORAL_TABLET | ORAL | Status: AC
  Filled 2016-01-30: qty 4

## 2016-01-30 MED ORDER — SIMETHICONE 80 MG PO CHEW: 80.0000 mg | CHEWABLE_TABLET | ORAL | Status: DC | PRN

## 2016-01-30 MED ORDER — LORATADINE 10 MG PO TABS: 10.0000 mg | ORAL_TABLET | Freq: Every day | ORAL | Status: DC | PRN

## 2016-01-30 MED ORDER — SODIUM BICARBONATE 8.4 % IV SOLN
INTRAVENOUS | Status: AC
  Filled 2016-01-30: qty 50

## 2016-01-30 MED ORDER — ACETAMINOPHEN 325 MG PO TABS
650.0000 mg | ORAL_TABLET | ORAL | Status: DC | PRN
  Administered 2016-02-01: 650 mg via ORAL
  Filled 2016-01-30: qty 2

## 2016-01-30 MED ORDER — PRENATAL MULTIVITAMIN CH
1.0000 | ORAL_TABLET | Freq: Every day | ORAL | Status: DC
  Administered 2016-01-30 – 2016-01-31 (×2): 1 via ORAL
  Filled 2016-01-30 (×3): qty 1

## 2016-01-30 MED ORDER — MORPHINE SULFATE (PF) 0.5 MG/ML IJ SOLN
INTRAMUSCULAR | Status: DC | PRN
  Administered 2016-01-30: 3.5 mg via EPIDURAL

## 2016-01-30 MED ORDER — MENTHOL 3 MG MT LOZG: 1.0000 | LOZENGE | OROMUCOSAL | Status: DC | PRN

## 2016-01-30 SURGICAL SUPPLY — 41 items
APL SKNCLS STERI-STRIP NONHPOA (GAUZE/BANDAGES/DRESSINGS) ×1
BARRIER ADHS 3X4 INTERCEED (GAUZE/BANDAGES/DRESSINGS) ×1 IMPLANT
BENZOIN TINCTURE PRP APPL 2/3 (GAUZE/BANDAGES/DRESSINGS) ×1 IMPLANT
BRR ADH 4X3 ABS CNTRL BYND (GAUZE/BANDAGES/DRESSINGS) ×1
CLAMP CORD UMBIL (MISCELLANEOUS) ×1 IMPLANT
CLOSURE STERI STRIP 1/2 X4 (GAUZE/BANDAGES/DRESSINGS) ×1 IMPLANT
CLOTH BEACON ORANGE TIMEOUT ST (SAFETY) ×1 IMPLANT
DRAPE LG THREE QUARTER DISP (DRAPES) ×1 IMPLANT
DRAPE SHEET LG 3/4 BI-LAMINATE (DRAPES) ×2 IMPLANT
DRSG OPSITE POSTOP 4X10 (GAUZE/BANDAGES/DRESSINGS) ×1 IMPLANT
DURAPREP 26ML APPLICATOR (WOUND CARE) ×1 IMPLANT
ELECT REM PT RETURN 9FT ADLT (ELECTROSURGICAL) ×2
ELECTRODE REM PT RTRN 9FT ADLT (ELECTROSURGICAL) IMPLANT
EXTENDER TRAXI PANNICULUS (MISCELLANEOUS) IMPLANT
GLOVE BIOGEL PI IND STRL 6.5 (GLOVE) IMPLANT
GLOVE BIOGEL PI INDICATOR 6.5 (GLOVE) ×1
GLOVE ECLIPSE 6.5 STRL STRAW (GLOVE) ×2 IMPLANT
GOWN STRL REUS W/TWL LRG LVL3 (GOWN DISPOSABLE) ×2 IMPLANT
NDL HYPO 25X5/8 SAFETYGLIDE (NEEDLE) IMPLANT
NEEDLE HYPO 25X5/8 SAFETYGLIDE (NEEDLE) ×2 IMPLANT
NS IRRIG 1000ML POUR BTL (IV SOLUTION) ×1 IMPLANT
PACK C SECTION WH (CUSTOM PROCEDURE TRAY) ×1 IMPLANT
PAD ABD 7.5X8 STRL (GAUZE/BANDAGES/DRESSINGS) ×1 IMPLANT
PAD OB MATERNITY 4.3X12.25 (PERSONAL CARE ITEMS) ×1 IMPLANT
PENCIL SMOKE EVAC W/HOLSTER (ELECTROSURGICAL) ×1 IMPLANT
RTRCTR C-SECT PINK 25CM LRG (MISCELLANEOUS) ×1 IMPLANT
SPONGE GAUZE 4X4 12PLY STER LF (GAUZE/BANDAGES/DRESSINGS) ×2 IMPLANT
SUT GUT PLAIN 0 CT-3 TAN 27 (SUTURE) ×1 IMPLANT
SUT PLAIN 2 0 (SUTURE) ×2
SUT PLAIN ABS 2-0 CT1 27XMFL (SUTURE) IMPLANT
SUT VIC AB 0 CT1 27 (SUTURE) ×10
SUT VIC AB 0 CT1 27XBRD ANBCTR (SUTURE) IMPLANT
SUT VIC AB 0 CTX 36 (SUTURE) ×6
SUT VIC AB 0 CTX36XBRD ANBCTRL (SUTURE) IMPLANT
SUT VIC AB 2-0 CT1 27 (SUTURE) ×2
SUT VIC AB 2-0 CT1 TAPERPNT 27 (SUTURE) IMPLANT
SUT VIC AB 3-0 CT1 27 (SUTURE) ×2
SUT VIC AB 3-0 CT1 TAPERPNT 27 (SUTURE) IMPLANT
SUT VIC AB 4-0 KS 27 (SUTURE) ×1 IMPLANT
TOWEL OR 17X24 6PK STRL BLUE (TOWEL DISPOSABLE) ×1 IMPLANT
TRAXI PANNICULUS EXTENDER (MISCELLANEOUS) ×1

## 2016-01-30 NOTE — Progress Notes (Addendum)
OB PN:  S: Pt resting comfortably s/p epidural  O: BP 120/58 mmHg  Pulse 83  Temp(Src) 97.8 F (36.6 C) (Oral)  Resp 16  Ht 5\' 1"  (1.549 m)  Wt 106.142 kg (234 lb)  BMI 44.24 kg/m2  SpO2 100%  LMP 04/29/2015  FHT: 125bpm, moderate variablity, no accels, variable, prolonged and late decels noted Toco: q3-314min, IUPC > 200 SVE: 5/90/-2, FSE placed, IUPC remains in place  A/P: 30 y.o. [id number] @ 5563w3d for IOL for GDMA1- TOLAC 1. FWB: Cat. II- pt repositioned, Pitocin off and IV fluid bolus- FHT recovered to 120 with minimal variability 2. Labor: Due to non-reassuring fetal heart tones with no further cervical dilation- plan for repeat C-section.  Reviewed risk/benefit and potential side effects.  Pt voiced understanding, question answered and wishes to proceed with repeat C-section Pain: epidural in place GBS: positive GDMA1-on arrival 84, no further accuchecks while in labor  -Ancef to OR, SCDs to be placed, anesthesia and OR notified  Myna HidalgoJennifer Ruhani Umland, DO [phone] (pager) [phone] (office)

## 2016-01-30 NOTE — Anesthesia Postprocedure Evaluation (Cosign Needed)
Anesthesia Post Note  Patient: Christina Payne  Procedure(s) Performed: Procedure(s): CESAREAN SECTION  Patient location during evaluation: Mother Baby Anesthesia Type: Epidural Level of consciousness: awake and alert and oriented Pain management: pain level controlled Vital Signs Assessment: post-procedure vital signs reviewed and stable Respiratory status: spontaneous breathing and respiratory function stable Cardiovascular status: stable Postop Assessment: no headache, no backache, patient able to bend at knees, no signs of nausea or vomiting and adequate PO intake Anesthetic complications: no    Last Vitals:  Filed Vitals:   01/30/16 0625 01/30/16 0730  BP: 105/70 115/68  Pulse: 94 87  Temp: 36.5 C 37 C  Resp: 20 18    Last Pain:  Filed Vitals:   01/30/16 0753  PainSc: 5                  Charli Halle

## 2016-01-30 NOTE — Progress Notes (Signed)
CSW received request for consult "for babies who have drug screen sent".    CSW completed chart review and is screening out referral since there is no evidence or documentation of substance use.   Re-consult CSW if additional needs arise.   Marzelle Rutten MSW, LCSW [phone] 

## 2016-01-30 NOTE — Lactation Note (Signed)
This note was copied from a baby's chart. Lactation Consultation Note  P3, BF other children 2-3 months. Mother concerned because baby has been sleepy at the breast the last few feedings. Also mother has started using hand pump and is getting drops.  Provided education about milk supply and described that this is normal. He has breastfed x3 since birth and is 6911 hours old. Encouraged mother and suggest STS. Reviewed supply and demand, stomach size, feeding behaviors after birth. Mom encouraged to feed baby 8-12 times/24 hours and with feeding cues.  Mom made aware of O/P services, breastfeeding support groups, community resources, and our phone # for post-discharge questions.  Mother has DEBP set up in room.  Reviewed how to use manual piston breast pump. Encouraged family to call if they need assistance with latching.  Patient Name: Christina Redge GainerShaquera Payne ZOXWR'UToday's Date: 01/30/2016 Reason for consult: Initial assessment   Maternal Data Has patient been taught Hand Expression?: Yes Does the patient have breastfeeding experience prior to this delivery?: Yes  Feeding Feeding Type: Breast Fed Length of feed: 15 min  LATCH Score/Interventions Latch: Repeated attempts needed to sustain latch, nipple held in mouth throughout feeding, stimulation needed to elicit sucking reflex. Intervention(s): Adjust position;Assist with latch;Breast compression  Audible Swallowing: A few with stimulation Intervention(s): Skin to skin;Hand expression  Type of Nipple: Everted at rest and after stimulation  Comfort (Breast/Nipple): Soft / non-tender     Hold (Positioning): Assistance needed to correctly position infant at breast and maintain latch. Intervention(s): Breastfeeding basics reviewed;Support Pillows;Position options;Skin to skin  LATCH Score: 7  Lactation Tools Discussed/Used Pump Review: Setup, frequency, and cleaning;Milk Storage Initiated by:: Morrie SheldonAshley RN Date initiated::  01/30/16   Consult Status Consult Status: Follow-up Date: 01/31/16 Follow-up type: In-patient    Dahlia ByesBerkelhammer, Tyreon Frigon [facility]oschen 01/30/2016, 12:46 PM

## 2016-01-30 NOTE — Anesthesia Postprocedure Evaluation (Signed)
Anesthesia Post Note  Patient: Christina Payne  Procedure(s) Performed: Procedure(s): CESAREAN SECTION  Patient location during evaluation: PACU Anesthesia Type: Epidural Level of consciousness: awake Pain management: pain level controlled Vital Signs Assessment: post-procedure vital signs reviewed and stable Respiratory status: spontaneous breathing Cardiovascular status: stable Postop Assessment: no headache, no backache, epidural receding, patient able to bend at knees and no signs of nausea or vomiting Anesthetic complications: no    Last Vitals:  Filed Vitals:   01/30/16 0243 01/30/16 0244  BP:    Pulse: 73 74  Temp:    Resp: 22 20    Last Pain:  Filed Vitals:   01/30/16 0248  PainSc: 3                  Alter Moss JR,JOHN Arshad Oberholzer

## 2016-01-30 NOTE — Transfer of Care (Signed)
Immediate Anesthesia Transfer of Care Note  Patient: Christina Payne  Procedure(s) Performed: Procedure(s): CESAREAN SECTION  Patient Location: PACU  Anesthesia Type:Epidural  Level of Consciousness: awake, alert  and oriented  Airway & Oxygen Therapy: Patient Spontanous Breathing  Post-op Assessment: Report given to RN and Post -op Vital signs reviewed and stable  Post vital signs: Reviewed and stable  Last Vitals:  Filed Vitals:   01/30/16 0012 01/30/16 0032  BP:  114/76  Pulse: 81 111  Temp:    Resp:  16    Complications: No apparent anesthesia complications

## 2016-01-30 NOTE — Addendum Note (Signed)
Addendum  created 01/30/16 0757 by Janeece Ageeynthia W Zivah Mayr, CRNA   Modules edited: Clinical Notes   Clinical Notes:  File: [id number]

## 2016-01-30 NOTE — Op Note (Signed)
PreOp Diagnosis:  1) Intrauterine pregnancy @ 4152w3d 2) Non-reassuring fetal heart tones remote from delivery 3) Failed TOLAC 4) GDMA1 5) Obesity  PostOp Diagnosis: same Procedure: Repeat LTCS Surgeon: Dr. Myna HidalgoJennifer Peri Kreft Assistant: Alphonzo Severanceachel Stall, CNM Anesthesia: epidural Complications: none EBL: 700cc UOP: 200cc Fluids: 1600cc  Findings: Female infant from vertex presentation with loose nuchal cord, normal uterus tubes and ovaries bilaterally.  PROCEDURE:  Informed consent was obtained from the patient with risks, benefits, complications, treatment options, and expected outcomes discussed with the patient.  The patient concurred with the proposed plan, giving informed consent with form signed.   The patient was taken to Operating Room, and identified with the procedure verified as C-Section Delivery with Time Out. With induction of anesthesia, the patient was prepped and draped in the usual sterile fashion. A Pfannenstiel incision was made and carried down through the subcutaneous tissue to the fascia. The fascia was incised in the midline and extended transversely. The superior aspect of the fascial incision was grasped with Kochers elevated and the underlying muscle dissected off. The inferior aspect of the facial incision was in similar fashion, grasped elevated and rectus muscles dissected off. The peritoneum was identified and entered. Peritoneal incision was extended longitudinally. The Alexis retractor was inserted.  The utero-vesical peritoneal reflection was identified and incised transversely with the [facility] scissors, the incision extended laterally, the bladder flap created digitally. A low transverse uterine incision was made and the infant's head delivered atraumatically after reduction of loose nuchal cord. After the umbilical cord was clamped and cut cord blood was obtained for evaluation.   The placenta was removed intact and appeared normal. The uterine outline, tubes and ovaries  appeared normal. The uterine incision was closed with running locked sutures of 0 Vicryl and a second layer of the same stitch was used in an imbricating fashion.  Excellent hemostasis was obtained.  The pericolic gutters were then cleared of all clots and debris. Interceed was placed over the hysterotomy.  Bleeding was noted from the rectus muscle and repaired using 3-0 vicryl.   The fascia was then reapproximated with running sutures of 0 Vicryl. The subcutaneous tissue was reapproximated with 2-0 plain gut suture.  The skin was closed with 4-0 vicryl in a subcuticular fashion.  Instrument, sponge, and needle counts were correct prior the abdominal closure and at the conclusion of the case. The patient was taken to recovery in stable condition.  Myna HidalgoJennifer Nas Wafer, DO [phone] (pager) [phone] (office)

## 2016-01-31 ENCOUNTER — Encounter (HOSPITAL_COMMUNITY): Payer: Self-pay | Admitting: Obstetrics & Gynecology

## 2016-01-31 LAB — CBC
HEMATOCRIT: 31.2 % — AB (ref 36.0–46.0)
Hemoglobin: 10.6 g/dL — ABNORMAL LOW (ref 12.0–15.0)
MCH: 30.1 pg (ref 26.0–34.0)
MCHC: 34 g/dL (ref 30.0–36.0)
MCV: 88.6 fL (ref 78.0–100.0)
Platelets: 175 10*3/uL (ref 150–400)
RBC: 3.52 MIL/uL — ABNORMAL LOW (ref 3.87–5.11)
RDW: 13.1 % (ref 11.5–15.5)
WBC: 9.7 10*3/uL (ref 4.0–10.5)

## 2016-01-31 NOTE — Progress Notes (Signed)
Postoperative Note Day # 1  S:  Patient resting comfortable in bed.  Pain controlled.  Tolerating general diet. + flatus, no BM.  Lochia moderate to minimal.  Ambulating without difficulty.  She denies n/v/f/c, SOB, or CP.  Pt plans on breastfeeding.  O: Temp:  [97.7 F (36.5 C)-98.7 F (37.1 C)] 97.7 F (36.5 C) (03/22 0607) Pulse Rate:  [70-87] 75 (03/22 0607) Resp:  [18-20] 18 (03/22 0607) BP: (111-116)/(56-68) 116/66 mmHg (03/22 0607) SpO2:  [96 %-100 %] 96 % (03/22 0034)   Gen: A&Ox3, NAD CV: RRR, no MRG Resp: CTAB Abdomen: obese, soft, NT, ND +BS Uterus: firm, non-tender, below umbilicus Incision: pressure dressing removed, honeycomb with old blood- appears dry and intact Ext: 1+ non-pitting edema, no calf tenderness bilaterally  Labs:  CBC Latest Ref Rng 01/31/2016 01/29/2016 06/17/2015  WBC 4.0 - 10.5 K/uL 9.7 7.4 10.5  Hemoglobin 12.0 - 15.0 g/dL 10.6(L) 12.9 12.4  Hematocrit 36.0 - 46.0 % 31.2(L) 37.5 36.9  Platelets 150 - 400 K/uL 175 202 230     A/P: Pt is a 30 y.o. [id number] s/p repeat C-section, POD#1  - Pain well controlled -GU: UOP is adequate -GI: Tolerating general diet -Activity: encouraged sitting up to chair and ambulation as tolerated -Prophylaxis: early ambulation, SCDs while in bed -Labs: stable as above, iron daily -Plan for outpatient baby boy circ  Myna HidalgoJennifer Katrianna Friesenhahn, DO [phone] (pager) [phone] (office)

## 2016-01-31 NOTE — Lactation Note (Signed)
This note was copied from a baby's chart. Lactation Consultation Note  Baby sleeping on mother's chest after recently breastfed. Mother denies problems or questions. Reviewed pp 24-25 Baby & Me booklet.  Mother has hand pump. Reviewed engorgement care and monitoring voids/stools. Encouraged her to call if she needs further assistance.  [name] FAOZH'YToday's Date: 01/31/2016 Reason for consult: Follow-up assessment   Maternal Data    Feeding Feeding Type: Breast Fed Length of feed: 10 min  LATCH Score/Interventions                      Lactation Tools Discussed/Used     Consult Status Consult Status: Complete    Hardie PulleyBerkelhammer, Tibor Lemmons Boschen 01/31/2016, 9:08 AM

## 2016-02-01 MED ORDER — OXYCODONE HCL 5 MG PO TABS: 5.0000 mg | ORAL_TABLET | ORAL | Status: DC | PRN

## 2016-02-01 MED ORDER — IBUPROFEN 600 MG PO TABS: 600.0000 mg | ORAL_TABLET | Freq: Four times a day (QID) | ORAL | Status: DC

## 2016-02-01 MED ORDER — DOCUSATE SODIUM 100 MG PO CAPS: 100.0000 mg | ORAL_CAPSULE | Freq: Two times a day (BID) | ORAL | Status: DC

## 2016-02-01 NOTE — Progress Notes (Signed)
Postoperative Note Day # 2  S:  Patient resting comfortable in bed.  Pain manageable, but reporting some soreness.  Tolerating general diet. + flatus, + BM.  Lochia moderate to minimal.  Ambulating without difficulty.  She denies n/v/f/c, SOB, or CP.  Pt breastfeeding.  O: Temp:  [98 F (36.7 C)] 98 F (36.7 C) (03/23 0445) Pulse Rate:  [76-79] 76 (03/23 0445) Resp:  [16-18] 16 (03/23 0445) BP: (102-115)/(58-69) 115/69 mmHg (03/23 0445)   Gen: A&Ox3, NAD CV: RRR, no MRG Resp: CTAB Abdomen: obese, soft, appropriately tender, ND +BS Uterus: firm, non-tender, below umbilicus Incision: honeycomb removed, steris mildly saturated with old blood, no drainage appreciated Ext: 1+ non-pitting edema, no calf tenderness bilaterally  Labs:  CBC Latest Ref Rng 01/31/2016 01/29/2016 06/17/2015  WBC 4.0 - 10.5 K/uL 9.7 7.4 10.5  Hemoglobin 12.0 - 15.0 g/dL 10.6(L) 12.9 12.4  Hematocrit 36.0 - 46.0 % 31.2(L) 37.5 36.9  Platelets 150 - 400 K/uL 175 202 230     A/P: Pt is a 30 y.o. [id number] s/p repeat C-section, POD#2  - Work on pain management- alternating between all meds -GU: Voiding freely, lochia appropriate -GI: Tolerating general diet and pt has voided -Activity: encouraged sitting up to chair and ambulation as tolerated -Prophylaxis: early ambulation, SCDs while in bed -Labs: stable as above, iron daily -Plan for outpatient baby boy circ  DISPO: Plan to replace steri strips today.  Meeting postop milestones appropriately, plan for discharge home today.  Myna HidalgoJennifer Yumna Ebers, DO [phone] (pager) [phone] (office)

## 2016-02-01 NOTE — Discharge Instructions (Signed)
Cesarean Delivery, Care After Refer to this sheet in the next few weeks. These instructions provide you with information on caring for yourself after your procedure. Your health care provider may also give you specific instructions. Your treatment has been planned according to current medical practices, but problems sometimes occur. Call your health care provider if you have any problems or questions after you go home. HOME CARE INSTRUCTIONS  Only take over-the-counter or prescription medications as directed by your health care provider.  For pain management please alternate between Oxycodone, Ibuprofen and over the counter Tylenol as needed.  Oxycodone may cause constipation, so please continue with stool softener twice daily as needed.  Do not drink alcohol, especially if you are breastfeeding or taking medication to relieve pain.  Do not chew or smoke tobacco.  Continue to use good perineal care. Good perineal care includes:  Wiping your perineum from front to back.  Keeping your perineum clean.  Check your surgical cut (incision) daily for increased redness, drainage, swelling, or separation of skin.  Clean your incision gently with soap and water every day, and then pat it dry. If your health care provider says it is okay, leave the incision uncovered. Use a bandage (dressing) if the incision is draining fluid or appears irritated. If the adhesive strips across the incision do not fall off within 7 days, carefully peel them off.  Hug a pillow when coughing or sneezing until your incision is healed. This helps to relieve pain.  Do not use tampons or douche until your health care provider says it is okay.  Shower, wash your hair, and take tub baths as directed by your health care provider.  Wear a well-fitting bra that provides breast support.  Limit wearing support panties or control-top hose.  Drink enough fluids to keep your urine clear or pale yellow.  Eat high-fiber foods  such as whole grain cereals and breads, brown rice, beans, and fresh fruits and vegetables every day. These foods may help prevent or relieve constipation.  Resume activities such as climbing stairs, driving, lifting, exercising, or traveling as directed by your health care provider.  Talk to your health care provider about resuming sexual activities. This is dependent upon your risk of infection, your rate of healing, and your comfort and desire to resume sexual activity.  Try to have someone help you with your household activities and your newborn for at least a few days after you leave the hospital.  Rest as much as possible. Try to rest or take a nap when your newborn is sleeping.  Increase your activities gradually.  Keep all of your scheduled postpartum appointments. It is very important to keep your scheduled follow-up appointments. At these appointments, your health care provider will be checking to make sure that you are healing physically and emotionally. SEEK MEDICAL CARE IF:   You are passing large clots from your vagina. Save any clots to show your health care provider.  You have a foul smelling discharge from your vagina.  You have trouble urinating.  You are urinating frequently.  You have pain when you urinate.  You have a change in your bowel movements.  You have increasing redness, pain, or swelling near your incision.  You have pus draining from your incision.  Your incision is separating.  You have painful, hard, or reddened breasts.  You have a severe headache.  You have blurred vision or see spots.  You feel sad or depressed.  You have thoughts of hurting  yourself or your newborn.  You have questions about your care, the care of your newborn, or medications.  You are dizzy or light-headed.  You have a rash.  You have pain, redness, or swelling at the site of the removed intravenous access (IV) tube.  You have nausea or vomiting.  You stopped  breastfeeding and have not had a menstrual period within 12 weeks of stopping.  You are not breastfeeding and have not had a menstrual period within 12 weeks of delivery.  You have a fever. SEEK IMMEDIATE MEDICAL CARE IF:  You have persistent pain.  You have chest pain.  You have shortness of breath.  You faint.  You have leg pain.  You have stomach pain.  Your vaginal bleeding saturates 2 or more sanitary pads in 1 hour. MAKE SURE YOU:   Understand these instructions.  Will watch your condition.  Will get help right away if you are not doing well or get worse.   This information is not intended to replace advice given to you by your health care provider. Make sure you discuss any questions you have with your health care provider.   Document Released: 07/20/2002 Document Revised: 11/18/2014 Document Reviewed: 06/24/2012 Elsevier Interactive Patient Education [employer]2016 [employer] Inc.

## 2016-02-01 NOTE — Progress Notes (Signed)
Pt has D/C and baby has D/C waiting on home health for bili lights.

## 2016-02-06 NOTE — Telephone Encounter (Signed)
pls call and congratulate her on our behalf for her baby's birth.  See how she is doing.

## 2016-02-06 NOTE — Telephone Encounter (Signed)
Called and spoke to pt she said thank you that she is doing well and will call to schedule physical once she has healed some

## 2016-02-08 NOTE — Discharge Summary (Signed)
OB Discharge Summary     [name] DOB: 04-Dec-1985 MRN: [id number]  Date of admission: 01/29/2016 Delivering MD: Myna Hidalgo   Date of discharge: 02/01/2016  Admitting diagnosis: INDUCTION Intrauterine pregnancy: [redacted]w[redacted]d     Secondary diagnosis:  Obesity, GDMA1, h/o postpartum preeclampsia, prior C-section and prior VBAC x1     Discharge diagnosis: Term Pregnancy Delivered and GDM A1                                                                                                Post partum procedures:N/A  Augmentation: AROM and Pitocin  Complications: None  Hospital course:  Induction of Labor With Cesarean Section  30 y.o. yo [phone] at [redacted]w[redacted]d was admitted to the hospital 01/29/2016 for induction of labor. Patient had a labor course significant for non-reassuring fetal heart tones remote from delivery- maximum dilation 4cm. The patient went for cesarean section due to Non-Reassuring FHR, and delivered a Viable infant.  Membrane Rupture Time/Date: )4:57 PM ,01/29/2016   Details of operation can be found in separate operative Note.  Patient had an uncomplicated postpartum course. She is ambulating, tolerating a regular diet, passing flatus, and urinating well.  Patient is discharged home in stable condition on 02/01/2016.                                     Physical exam  Filed Vitals:   01/31/16 0607 01/31/16 1800 02/01/16 0445 02/01/16 2040  BP: 116/66 102/58 115/69 118/70  Pulse: 75 79 76 80  Temp: 97.7 F (36.5 C) 98 F (36.7 C) 98 F (36.7 C) 98.2 F (36.8 C)  TempSrc:  Oral Oral Tympanic  Resp: Height:      Weight:      SpO2:       General: alert, cooperative and no distress Lochia: appropriate Uterine Fundus: firm Incision: Dressing is clean, dry, and intact DVT Evaluation: No evidence of DVT seen on physical exam. Labs: Lab Results  Component Value Date   WBC 9.7 01/31/2016   HGB 10.6* 01/31/2016   HCT 31.2* 01/31/2016   MCV 88.6 01/31/2016   PLT 175 01/31/2016   CMP Latest Ref Rng 01/29/2016  Glucose 65 - 99 mg/dL 84  BUN 6 - 23 mg/dL -  Creatinine 9.62 - 9.52 mg/dL -  Sodium 841 - 324 mEq/L -  Potassium 3.5 - 5.3 mEq/L -  Chloride 96 - 112 mEq/L -  CO2 19 - 32 mEq/L -  Calcium 8.4 - 10.5 mg/dL -  Total Protein 6.0 - 8.3 g/dL -  Total Bilirubin 0.3 - 1.2 mg/dL -  Alkaline Phos 39 - 401 U/L -  AST 0 - 37 U/L -  ALT 0 - 35 U/L -    Discharge instruction: per After Visit Summary and "Baby and Me Booklet".  After visit meds:    Medication List    STOP taking these medications        aspirin 81 MG tablet  TAKE these medications        docusate sodium 100 MG capsule  Commonly known as:  COLACE  Take 1 capsule (100 mg total) by mouth 2 (two) times daily.     ibuprofen 600 MG tablet  Commonly known as:  ADVIL,MOTRIN  Take 1 tablet (600 mg total) by mouth every 6 (six) hours.     loratadine 10 MG tablet  Commonly known as:  CLARITIN  Take 10 mg by mouth daily as needed for allergies.     oxyCODONE 5 MG immediate release tablet  Commonly known as:  Oxy IR/ROXICODONE  Take 1 tablet (5 mg total) by mouth every 4 (four) hours as needed (pain scale 4-7).     prenatal multivitamin Tabs tablet  Take 1 tablet by mouth daily at 12 noon.        Diet: carb modified diet  Activity: Advance as tolerated. Pelvic rest for 6 weeks.   Outpatient follow up:2 weeks Follow up Appt:No future appointments. Follow up Visit:No Follow-up on file.  Postpartum contraception: Progesterone only pills  Newborn Data: Live born female  Birth Weight: 7 lb 2.8 oz (3255 g) APGAR: 8, 9  Baby Feeding: Breast Disposition:home with mother   02/08/2016 Myna HidalgoZAN, Christina Alonzo, M, DO

## 2016-02-27 IMAGING — DX DG ANKLE COMPLETE 3+V*R*
3 series · 3 of 3 positions shown · non-contrast
Comparison: None.

CLINICAL DATA: Fall with right ankle injury today and right ankle
pain. Initial encounter.

EXAM:
RIGHT ANKLE - COMPLETE 3+ VIEW

[ankle ap]
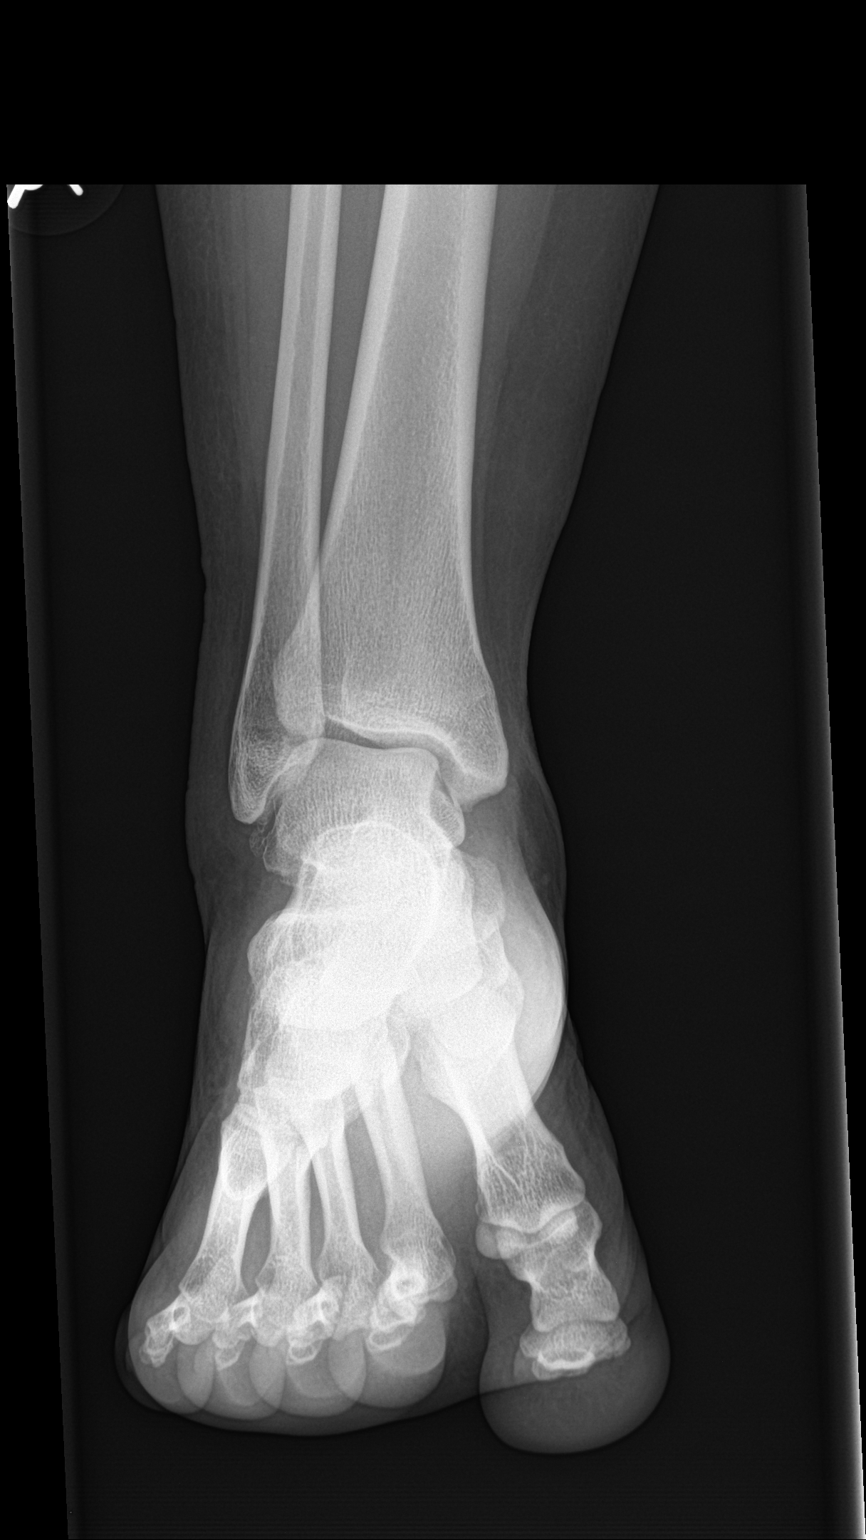

[ankle obl]
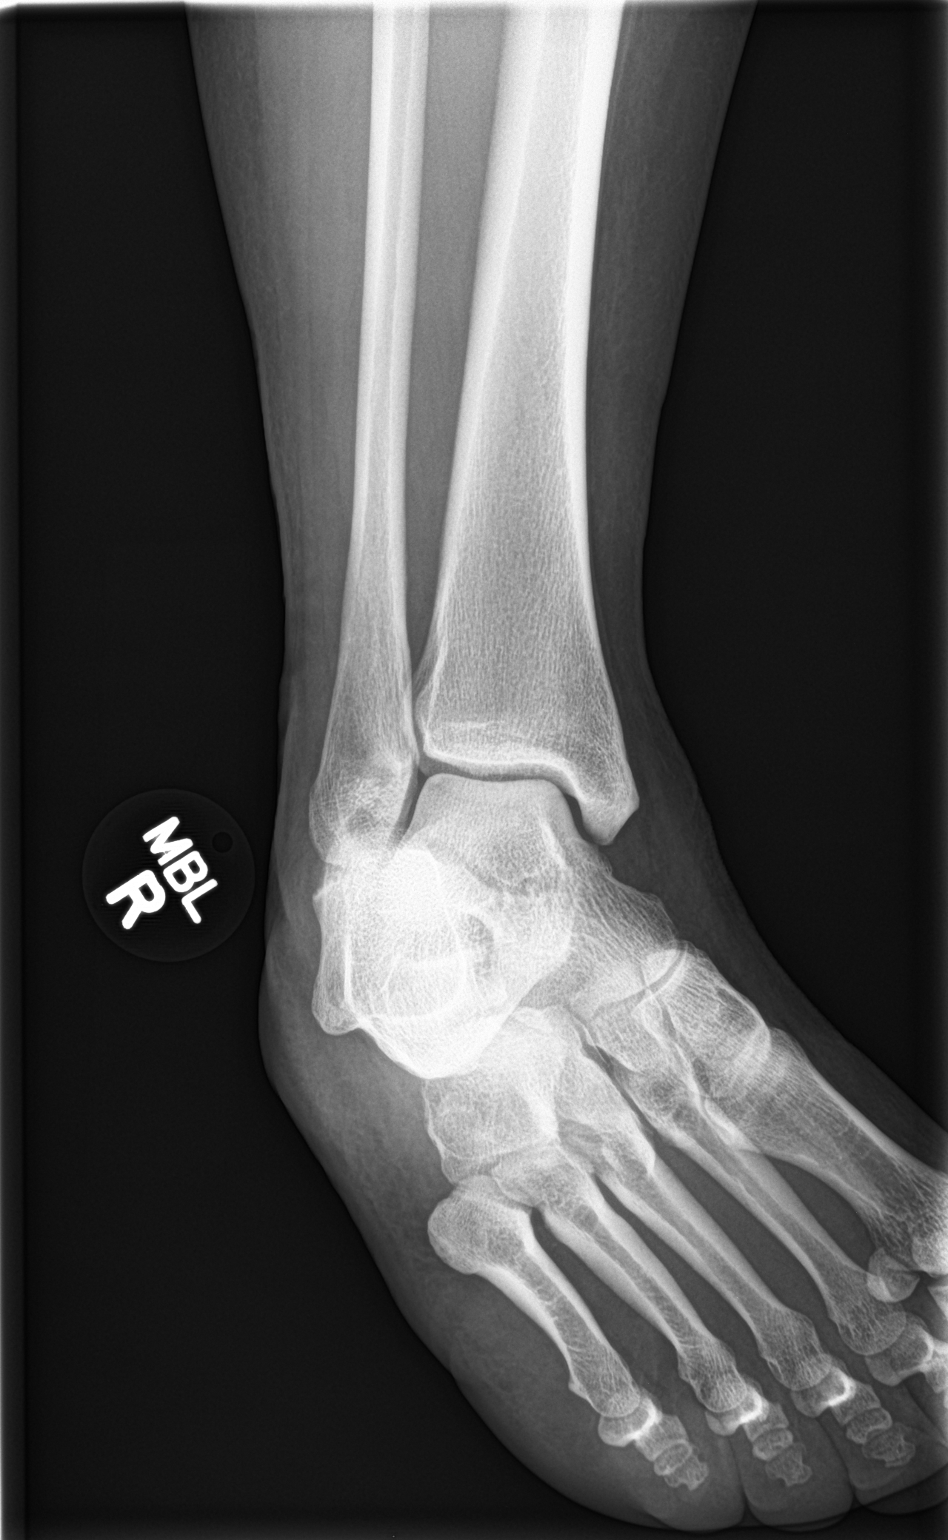

[ankle lat]
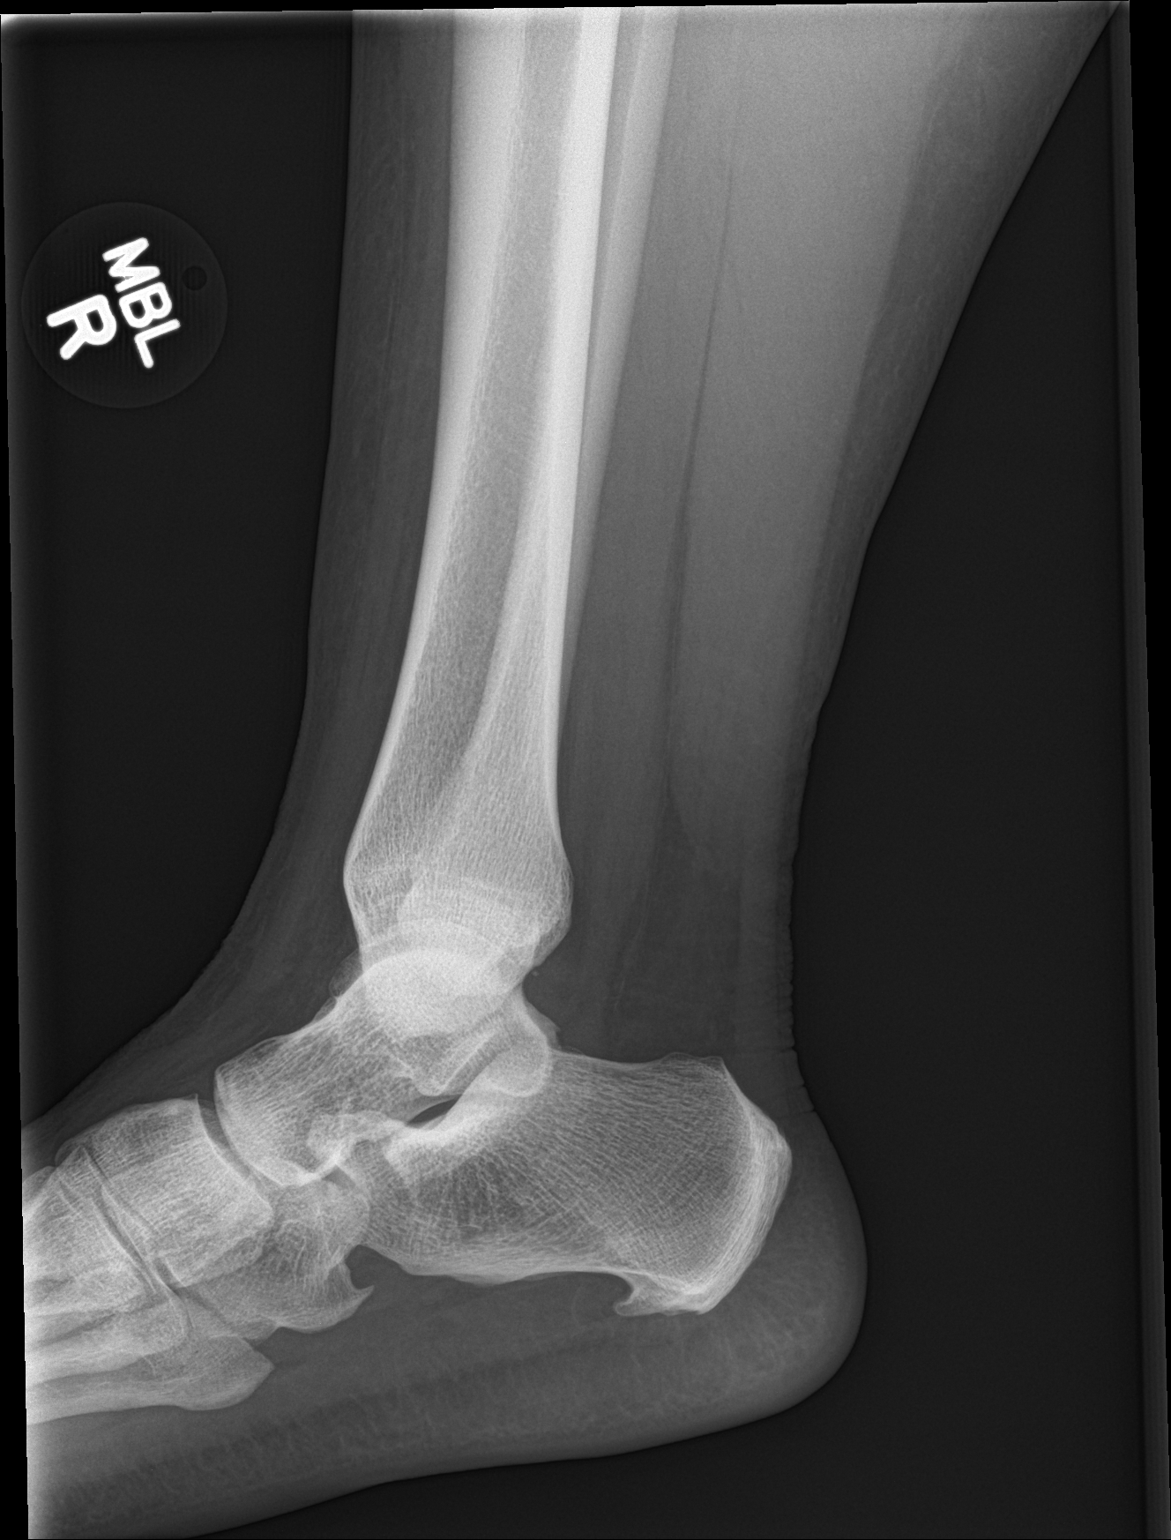

[3 of 3 positions shown; findings below may reference images not displayed]

FINDINGS: A small bony density at the fibular tip is compatible with age
indeterminate avulsion fracture. Mild lateral soft tissue swelling
is noted.

No other fracture, subluxation or dislocation identified. The joint
spaces are otherwise unremarkable.

A small calcaneal spur is present.
IMPRESSION: Small bony density at the fibular tip compatible with an avulsion
fracture of uncertain chronicity -correlate clinically.

Small calcaneal spur.

## 2017-03-04 ENCOUNTER — Ambulatory Visit
Admission: RE | Admit: 2017-03-04 | Discharge: 2017-03-04 | Disposition: A | Discharge status: Home / Self Care | Payer: 59 | Source: Ambulatory Visit | Attending: Medical | Admitting: Medical

## 2017-03-04 ENCOUNTER — Encounter: Payer: Self-pay | Admitting: Medical

## 2017-03-04 ENCOUNTER — Ambulatory Visit (INDEPENDENT_AMBULATORY_CARE_PROVIDER_SITE_OTHER): Payer: 59 | Admitting: Medical

## 2017-03-04 VITALS — BP 114/78 | HR 96 | Wt 246.4 lb

## 2017-03-04 DIAGNOSIS — M25432 Effusion, left wrist: Secondary | ICD-10-CM | POA: Diagnosis not present

## 2017-03-04 DIAGNOSIS — M25532 Pain in left wrist: Secondary | ICD-10-CM

## 2017-03-04 NOTE — Progress Notes (Signed)
Subjective: Chief Complaint  Patient presents with  . wrist pain    wrist pain x 3 weeks ago    Here for c/o 3 wk hx/o wrist pain.  Here for c/o left wrist pain x 3 weeks.  Denies fall, but not sure if she injured it somehow.  She did do some exercise with kettle bells 10-12lb recently. Does sometimes bump the arm on things, has boys/sons she chases after.  Does get tingling or numb in the hand in sleep but not otherwise.  At night feels like the whole hand is numb, not just certain fingers.  certainly gets pain with wrist ROM, worse with extension.  She note swelling of the wrist.   Not using OTC analgesic other than ibuprofen some 2 weeks ago when she had a tooth pulled.  Has had some carpal tunnel issues in the past, but this seems different.   She is right handed.   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   Past Medical History:  Diagnosis Date  . Abnormal Pap smear    Dr. Clearance Coots  . Allergic rhinitis   . Contraception management    [facility], Dr. Clearance Coots  . Dysmenorrhea    on Implanon + OCP per Dr. Clearance Coots  . GERD (gastroesophageal reflux disease)   . Gestational diabetes mellitus (GDM), antepartum   . Hypertension    hospitalized 11/01/10  . Obesity   . Pulmonary edema 12/11   postpartum, hospitalized;    . Wears glasses    Past Surgical History:  Procedure Laterality Date  . CESAREAN SECTION  01/2008  . CESAREAN SECTION  01/30/2016   Procedure: CESAREAN SECTION;  Surgeon: Myna Hidalgo, DO;  Location: [facility];  Service: Obstetrics;;    ROS as in subjective   Objective: BP 114/78   Pulse 96   Wt 246 lb 6.4 oz (111.8 kg)   SpO2 99%   BMI 46.56 kg/m   gen: wd, wn, nad  Skin: no left wrist or arm bruising or erythema She is quite tender of left lateral wrist and distal left lateral forearm, tender midline left wrist but not medial side, there is decreased ROM of wrist with all ROM due to pain She has pain with resisted and  passive wrist ROM, has pain in the lateral wrist with finger resisted flexion and extension There is puffiness of the dorsal left lateral wrist Finger strength and sensation is normal, but wrist strength decreased currently presumed due to pain Rest of arm exam unremarkable, tattoo left mid forearm   Assessment: Encounter Diagnoses  Name Primary?  . Left wrist pain Yes  . Wrist swelling, left      Plan: Discussed exam findings, symptoms.  Will send for xray to rule out fracture.  Possible tenosynovitis and sprain of left wrist likely from the kettle bell exercises.  Advised ice or bag of frozen peas with cloth between skin and ice x 20 min 2-3 times daily.   She has Aleve OTC, will use 2 tablets BID the next week.   Rest, and if no fracture will use reinforced wrist splint OTC.   Consider occupational therapy after period of rest and immobilization of wrist.  F/u pending xray.

## 2018-06-15 ENCOUNTER — Encounter (HOSPITAL_COMMUNITY): Payer: Self-pay | Admitting: Emergency Medicine

## 2018-06-15 ENCOUNTER — Ambulatory Visit (HOSPITAL_COMMUNITY)
Admission: EM | Admit: 2018-06-15 | Discharge: 2018-06-15 | Disposition: A | Discharge status: Home / Self Care | Payer: 59 | Attending: Emergency Medicine | Admitting: Emergency Medicine

## 2018-06-15 DIAGNOSIS — I10 Essential (primary) hypertension: Secondary | ICD-10-CM | POA: Insufficient documentation

## 2018-06-15 DIAGNOSIS — N644 Mastodynia: Secondary | ICD-10-CM | POA: Diagnosis present

## 2018-06-15 DIAGNOSIS — Z9889 Other specified postprocedural states: Secondary | ICD-10-CM | POA: Diagnosis not present

## 2018-06-15 DIAGNOSIS — N61 Mastitis without abscess: Secondary | ICD-10-CM | POA: Diagnosis not present

## 2018-06-15 MED ORDER — CEPHALEXIN 500 MG PO CAPS: 500.0000 mg | ORAL_CAPSULE | Freq: Three times a day (TID) | ORAL | 0 refills | Status: DC

## 2018-06-15 NOTE — ED Provider Notes (Signed)
  MRN: [id number] DOB: Sep 30, 1986  Subjective:   Christina Payne is a 32 y.o. female presenting for 2-day history of recurrent left breast pain and slight drainage.  Patient had nipples pierced about 6 weeks ago.  Her pain had improved but returned this past weekend.  She has been using ibuprofen and has tried to express drainage.  Denies fever, nausea, vomiting, swelling.  Denies allergies to antibiotics.  No current facility-administered medications for this encounter.   Current Outpatient Medications:  .  loratadine (CLARITIN) 10 MG tablet, Take 10 mg by mouth daily as needed for allergies. , Disp: , Rfl:  .  norgestimate-ethinyl estradiol (ORTHO-CYCLEN,SPRINTEC,PREVIFEM) 0.25-35 MG-MCG tablet, Take 1 tablet by mouth daily., Disp: , Rfl:  .  Prenatal Vit-Fe Fumarate-FA (PRENATAL MULTIVITAMIN) TABS tablet, Take 1 tablet by mouth daily at 12 noon., Disp: , Rfl:  .  cephALEXin (KEFLEX) 500 MG capsule, Take 1 capsule (500 mg total) by mouth 3 (three) times daily., Disp: 15 capsule, Rfl: 0 .  ibuprofen (ADVIL,MOTRIN) 600 MG tablet, Take 1 tablet (600 mg total) by mouth every 6 (six) hours., Disp: 30 tablet, Rfl: 0   No Known Allergies  Past Medical History:  Diagnosis Date  . Abnormal Pap smear    Dr. Clearance CootsHarper  . Allergic rhinitis   . Contraception management    [facility], Dr. Clearance CootsHarper  . Dysmenorrhea    on Implanon + OCP per Dr. Clearance CootsHarper  . GERD (gastroesophageal reflux disease)   . Gestational diabetes mellitus (GDM), antepartum   . Hypertension    hospitalized 11/01/10  . Obesity   . Pulmonary edema 12/11   postpartum, hospitalized;    . Wears glasses      Past Surgical History:  Procedure Laterality Date  . CESAREAN SECTION  01/2008  . CESAREAN SECTION  01/30/2016   Procedure: CESAREAN SECTION;  Surgeon: Myna HidalgoJennifer Ozan, DO;  Location: [facility];  Service: Obstetrics;;    Objective:   Vitals: BP 138/84   Pulse 93   Temp 98.3 F (36.8 C) (Oral)   Resp 16   LMP  06/10/2018   SpO2 98%   Examined with nurse Efraim KaufmannMelissa who served as a Biomedical engineerchaperone.  Physical Exam  Constitutional: She is oriented to person, place, and time. She appears well-developed and well-nourished.  Cardiovascular: Normal rate.  Pulmonary/Chest: Effort normal.    Neurological: She is alert and oriented to person, place, and time.   Assessment and Plan :   Mastitis  Breast pain, left  Start Keflex to address infectious process, wound culture pending.  Patient is also use warm compresses, APAP and ibuprofen for pain and inflammation.  Return to clinic precautions reviewed.   Wallis BambergMani, Aharon Carriere, [place] JerseyPA-C 06/15/18 1955

## 2018-06-15 NOTE — Discharge Instructions (Signed)
You may take 500mg  Tylenol with ibuprofen 400-600mg  every 6 hours for pain and inflammation. Use warm compresses ~3 times per day for 10-15 minutes.

## 2018-06-15 NOTE — ED Triage Notes (Signed)
Pt got nipples pieced 6 weeks ago. Left nipple has been painful since Saturday.

## 2018-06-18 LAB — AEROBIC CULTURE W GRAM STAIN (SUPERFICIAL SPECIMEN): Gram Stain: NONE SEEN

## 2018-06-18 LAB — AEROBIC CULTURE  (SUPERFICIAL SPECIMEN)

## 2018-06-19 ENCOUNTER — Telehealth (HOSPITAL_COMMUNITY): Payer: Self-pay

## 2018-06-19 NOTE — Telephone Encounter (Signed)
Culture positive for staff. Per dr Delton Seenelson patient was treated properly with Keflex. Attempted to reach patient to follow up. No answer at this time. Voicemail left.

## 2020-03-06 ENCOUNTER — Ambulatory Visit (INDEPENDENT_AMBULATORY_CARE_PROVIDER_SITE_OTHER): Payer: BC Managed Care – PPO | Admitting: Orthopedic Surgery

## 2020-03-06 ENCOUNTER — Other Ambulatory Visit: Payer: Self-pay

## 2020-03-06 ENCOUNTER — Encounter: Payer: Self-pay | Admitting: Orthopedic Surgery

## 2020-03-06 VITALS — Ht 61.0 in | Wt 246.0 lb

## 2020-03-06 DIAGNOSIS — S92501A Displaced unspecified fracture of right lesser toe(s), initial encounter for closed fracture: Secondary | ICD-10-CM

## 2020-03-06 DIAGNOSIS — S91114A Laceration without foreign body of right lesser toe(s) without damage to nail, initial encounter: Secondary | ICD-10-CM

## 2020-03-06 MED ORDER — MUPIROCIN 2 % EX OINT: 1.0000 "application " | TOPICAL_OINTMENT | Freq: Two times a day (BID) | CUTANEOUS | 3 refills | Status: DC
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
## 2020-03-13 ENCOUNTER — Encounter: Payer: Self-pay | Admitting: Orthopedic Surgery

## 2020-03-13 ENCOUNTER — Ambulatory Visit (INDEPENDENT_AMBULATORY_CARE_PROVIDER_SITE_OTHER): Payer: BC Managed Care – PPO | Admitting: Orthopedic Surgery

## 2020-03-13 ENCOUNTER — Other Ambulatory Visit: Payer: Self-pay

## 2020-03-13 VITALS — Ht 61.0 in | Wt 246.0 lb

## 2020-03-13 DIAGNOSIS — G90521 Complex regional pain syndrome I of right lower limb: Secondary | ICD-10-CM | POA: Diagnosis not present

## 2020-03-13 MED ORDER — DULOXETINE HCL 30 MG PO CPEP: 30.0000 mg | ORAL_CAPSULE | Freq: Every day | ORAL | 1 refills | Status: DC

## 2020-03-13 MED ORDER — GABAPENTIN 300 MG PO CAPS: 300.0000 mg | ORAL_CAPSULE | Freq: Three times a day (TID) | ORAL | 1 refills | Status: DC
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
## 2020-03-13 NOTE — Progress Notes (Signed)
Called and no one answered but pt has regular bcbs.

## 2020-03-13 NOTE — Progress Notes (Signed)
Please call patient to see if she transferred care.  From what I can tell it looks like she is seeing a new primary care provider, I assume due to new insurance since is a [facility] provider?  If this is the case then please remove me as PCP so I do not get these he medications from other doctors.  I was curious why she transferred care

## 2020-03-20 ENCOUNTER — Other Ambulatory Visit: Payer: Self-pay

## 2020-03-20 ENCOUNTER — Telehealth: Payer: Self-pay | Admitting: Physician Assistant

## 2020-03-20 ENCOUNTER — Encounter: Payer: Self-pay | Admitting: Orthopedic Surgery

## 2020-03-20 ENCOUNTER — Ambulatory Visit (INDEPENDENT_AMBULATORY_CARE_PROVIDER_SITE_OTHER): Payer: BC Managed Care – PPO | Admitting: Physician Assistant

## 2020-03-20 VITALS — Ht 61.0 in | Wt 246.0 lb

## 2020-03-20 DIAGNOSIS — S91114A Laceration without foreign body of right lesser toe(s) without damage to nail, initial encounter: Secondary | ICD-10-CM | POA: Diagnosis not present

## 2020-03-20 NOTE — Telephone Encounter (Signed)
Patient called requesting that today's office notes be faxed to [facility] so that they can extend her STD.  CB#[phone].  Thank you.

## 2020-03-20 NOTE — Progress Notes (Signed)
Office Visit Note   Patient: Christina Payne           Date of Birth: Apr 20, 1986           MRN: [id number] Visit Date: 03/20/2020              Requested by: Jac Canavan, PA-C [address] [place],  [place] [address] PCP: Jac Canavan, PA-C  Chief Complaint  Patient presents with  . Right Foot - Follow-up    DOI 03/03/20 traumatic laceration 4th and 5th toes.       HPI: This is a 34 year old woman who is here 3 weeks status post laceration to the plantar surface of her fourth and fifth toe.  This was irrigated and sutured elsewhere.  She also has a fracture to the pinky toe.  On the right foot.  She is doing well.  She could not tolerate the Neurontin.  She has been taking Cymbalta which she seems to tolerate better  Assessment & Plan: Visit Diagnoses: No diagnosis found.  Plan: Continue to be weightbearing in the postop shoe follow-up in 3 weeks at which time x-ray should be taken.  She will need to be out of work for 3 more weeks as she has to be full weightbearing in a regular shoe and she cannot yet tolerate this.  A note was provided to her today  Follow-Up Instructions: No follow-ups on file.   Ortho Exam  Patient is alert, oriented, no adenopathy, well-dressed, normal affect, normal respiratory effort. Right foot sutures are in place and the plantar surface of the fourth and fifth toes.  These were removed.  She was extremely sensitive to touch.  She has no surrounding cellulitis mild soft tissue swelling skin is in good condition well approximated wound edges no drainage  Imaging: No results found. No images are attached to the encounter.  Labs: Lab Results  Component Value Date   REPTSTATUS 06/18/2018 FINAL 06/15/2018   GRAMSTAIN  06/15/2018    NO WBC SEEN NO ORGANISMS SEEN Performed at [facility], [address], [place], [place] [address]    CULT RARE STAPHYLOCOCCUS AUREUS 06/15/2018   LABORGA STAPHYLOCOCCUS AUREUS 06/15/2018      Lab Results  Component Value Date   ALBUMIN 3.9 04/26/2013   ALBUMIN 2.6 (L) 11/01/2010   ALBUMIN 3.7 03/11/2010    No results found for: MG No results found for: VD25OH  No results found for: PREALBUMIN CBC EXTENDED Latest Ref Rng & Units 01/31/2016 01/29/2016 06/17/2015  WBC 4.0 - 10.5 K/uL 9.7 7.4 10.5  RBC 3.87 - 5.11 MIL/uL 3.52(L) 4.23 4.08  HGB 12.0 - 15.0 g/dL 10.6(L) 12.9 12.4  HCT 36.0 - 46.0 % 31.2(L) 37.5 36.9  PLT 150 - 400 K/uL 175 202 230  NEUTROABS 1.7 - 7.7 K/uL - - -  LYMPHSABS 0.7 - 4.0 K/uL - - -     Body mass index is 46.48 kg/m.  Orders:  No orders of the defined types were placed in this encounter.  No orders of the defined types were placed in this encounter.    Procedures: No procedures performed  Clinical Data: No additional findings.  ROS:  All other systems negative, except as noted in the HPI. Review of Systems  Objective: Vital Signs: Ht 5\' 1"  (1.549 m)   Wt 246 lb (111.6 kg)   BMI 46.48 kg/m   Specialty Comments:  No specialty comments available.  PMFS History: Patient Active Problem List   Diagnosis  Date Noted  . Left wrist pain 03/04/2017  . Wrist swelling, left 03/04/2017  . Encounter for trial of labor 01/29/2016  . Obesity 05/25/2014  . Insertion of implantable subdermal contraceptive 06/03/2013  . Abnormal uterine bleeding (AUB) 05/19/2013   Past Medical History:  Diagnosis Date  . Abnormal Pap smear    Dr. Jodi Mourning  . Allergic rhinitis   . Contraception management    [facility], Dr. Jodi Mourning  . Dysmenorrhea    on Implanon + OCP per Dr. Jodi Mourning  . GERD (gastroesophageal reflux disease)   . Gestational diabetes mellitus (GDM), antepartum   . Hypertension    hospitalized 11/01/10  . Obesity   . Pulmonary edema 12/11   postpartum, hospitalized;    . Wears glasses     Family History  Problem Relation Age of Onset  . Hypertension Mother   . Fibromyalgia Mother   . Asthma Mother   . Allergic  rhinitis Mother   . Other Father        unknown  . Other Sister        died at birth, mother had eclampsia  . Allergic rhinitis Brother   . Other Brother        auditory processing disorder, eating disorder, sleeping disorder  . Cancer Paternal Grandmother        lung  . Diabetes Paternal Grandmother   . Heart disease Neg Hx     Past Surgical History:  Procedure Laterality Date  . CESAREAN SECTION  01/2008  . CESAREAN SECTION  01/30/2016   Procedure: CESAREAN SECTION;  Surgeon: Janyth Pupa, DO;  Location: [facility];  Service: Obstetrics;;   Social History   Occupational History  . Not on file  Tobacco Use  . Smoking status: Never Smoker  . Smokeless tobacco: Never Used  Substance and Sexual Activity  . Alcohol use: Yes    Alcohol/week: 2.0 standard drinks    Types: 2 Glasses of wine per week    Comment: occasional  . Drug use: No    Frequency: 1.0 times per week  . Sexual activity: Yes    Birth control/protection: OCP

## 2020-03-21 NOTE — Telephone Encounter (Signed)
Faxed to [phone] claim # [id number]

## 2020-03-23 ENCOUNTER — Ambulatory Visit (INDEPENDENT_AMBULATORY_CARE_PROVIDER_SITE_OTHER): Payer: BC Managed Care – PPO | Admitting: Physician Assistant

## 2020-03-23 ENCOUNTER — Encounter: Payer: Self-pay | Admitting: Orthopedic Surgery

## 2020-03-23 ENCOUNTER — Other Ambulatory Visit: Payer: Self-pay

## 2020-03-23 DIAGNOSIS — S91114A Laceration without foreign body of right lesser toe(s) without damage to nail, initial encounter: Secondary | ICD-10-CM

## 2020-03-23 NOTE — Progress Notes (Signed)
Office Visit Note   Patient: Christina Payne           Date of Birth: 11-21-1985           MRN: [id number] Visit Date: 03/23/2020              Requested by: Jac Canavan, PA-C [address] [place],  [place] [address] PCP: Jac Canavan, PA-C  Chief Complaint  Patient presents with  . Right Foot - Follow-up, Wound Check      HPI: Patient returns today she is status post laceration under her toes.  Her sutures were removed a couple days ago but unfortunately 1 suture was missed.  This was noticed by her son.  Assessment & Plan: Visit Diagnoses: No diagnosis found.  Plan: She will follow up in 2 weeks with new x-rays of her foot.  After obtaining consent I did find the suture this was removed successfully  Follow-Up Instructions: No follow-ups on file.   Ortho Exam  Patient is alert, oriented, no adenopathy, well-dressed, normal affect, normal respiratory effort. Focused examination of her foot demonstrates no drainage no cellulitis minimal soft tissue swelling she does have a retained suture under her fourth toe no surrounding cellulitis  Imaging: No results found. No images are attached to the encounter.  Labs: Lab Results  Component Value Date   REPTSTATUS 06/18/2018 FINAL 06/15/2018   GRAMSTAIN  06/15/2018    NO WBC SEEN NO ORGANISMS SEEN Performed at [facility], [address] [place], [place] [address]    CULT RARE STAPHYLOCOCCUS AUREUS 06/15/2018   LABORGA STAPHYLOCOCCUS AUREUS 06/15/2018     Lab Results  Component Value Date   ALBUMIN 3.9 04/26/2013   ALBUMIN 2.6 (L) 11/01/2010   ALBUMIN 3.7 03/11/2010    No results found for: MG No results found for: VD25OH  No results found for: PREALBUMIN CBC EXTENDED Latest Ref Rng & Units 01/31/2016 01/29/2016 06/17/2015  WBC 4.0 - 10.5 K/uL 9.7 7.4 10.5  RBC 3.87 - 5.11 MIL/uL 3.52(L) 4.23 4.08  HGB 12.0 - 15.0 g/dL 10.6(L) 12.9 12.4  HCT 36.0 - 46.0 % 31.2(L) 37.5 36.9  PLT 150 - 400  K/uL 175 202 230  NEUTROABS 1.7 - 7.7 K/uL - - -  LYMPHSABS 0.7 - 4.0 K/uL - - -     There is no height or weight on file to calculate BMI.  Orders:  No orders of the defined types were placed in this encounter.  No orders of the defined types were placed in this encounter.    Procedures: No procedures performed  Clinical Data: No additional findings.  ROS:  All other systems negative, except as noted in the HPI. Review of Systems  Objective: Vital Signs: There were no vitals taken for this visit.  Specialty Comments:  No specialty comments available.  PMFS History: Patient Active Problem List   Diagnosis Date Noted  . Left wrist pain 03/04/2017  . Wrist swelling, left 03/04/2017  . Encounter for trial of labor 01/29/2016  . Obesity 05/25/2014  . Insertion of implantable subdermal contraceptive 06/03/2013  . Abnormal uterine bleeding (AUB) 05/19/2013   Past Medical History:  Diagnosis Date  . Abnormal Pap smear    Dr. Clearance Coots  . Allergic rhinitis   . Contraception management    [facility], Dr. Clearance Coots  . Dysmenorrhea    on Implanon + OCP per Dr. Clearance Coots  . GERD (gastroesophageal reflux disease)   . Gestational diabetes mellitus (GDM), antepartum   .  Hypertension    hospitalized 11/01/10  . Obesity   . Pulmonary edema 12/11   postpartum, hospitalized;    . Wears glasses     Family History  Problem Relation Age of Onset  . Hypertension Mother   . Fibromyalgia Mother   . Asthma Mother   . Allergic rhinitis Mother   . Other Father        unknown  . Other Sister        died at birth, mother had eclampsia  . Allergic rhinitis Brother   . Other Brother        auditory processing disorder, eating disorder, sleeping disorder  . Cancer Paternal Grandmother        lung  . Diabetes Paternal Grandmother   . Heart disease Neg Hx     Past Surgical History:  Procedure Laterality Date  . CESAREAN SECTION  01/2008  . CESAREAN SECTION  01/30/2016    Procedure: CESAREAN SECTION;  Surgeon: Janyth Pupa, DO;  Location: [facility];  Service: Obstetrics;;   Social History   Occupational History  . Not on file  Tobacco Use  . Smoking status: Never Smoker  . Smokeless tobacco: Never Used  Substance and Sexual Activity  . Alcohol use: Yes    Alcohol/week: 2.0 standard drinks    Types: 2 Glasses of wine per week    Comment: occasional  . Drug use: No    Frequency: 1.0 times per week  . Sexual activity: Yes    Birth control/protection: OCP

## 2020-04-06 ENCOUNTER — Other Ambulatory Visit: Payer: Self-pay

## 2020-04-06 ENCOUNTER — Ambulatory Visit (INDEPENDENT_AMBULATORY_CARE_PROVIDER_SITE_OTHER): Payer: BC Managed Care – PPO | Admitting: Orthopedic Surgery

## 2020-04-06 ENCOUNTER — Encounter: Payer: Self-pay | Admitting: Orthopedic Surgery

## 2020-04-06 ENCOUNTER — Ambulatory Visit: Payer: Self-pay

## 2020-04-06 VITALS — Ht 61.0 in | Wt 246.0 lb

## 2020-04-06 DIAGNOSIS — M79671 Pain in right foot: Secondary | ICD-10-CM

## 2020-04-06 NOTE — Progress Notes (Signed)
Office Visit Note   Patient: Christina Payne           Date of Birth: 1986/04/17           MRN: [id number] Visit Date: 04/06/2020              Requested by: Jac Canavan, PA-C [address] [place],  [place] [address] PCP: Jac Canavan, PA-C  Chief Complaint  Patient presents with  . Right Foot - Follow-up      HPI: This is a pleasant woman who follows up today for the laceration on her right foot toes that was sutured elsewhere.  She also is following up on her toe fracture.  She continues to do well.  She is trying to work on desensitization of the toes.  She still believes there is some surgical suture remaining.   Plan: Visit Diagnoses:  1. Pain in right foot     Plan: She should continue to elevate and wear her surgical shoe.  She will continue to work on swelling desensitization.  She understands that if she stands on her leg for long periods of time she will increase the swelling.  And foot must be protected only in the postop shoe  Follow-Up Instructions: No follow-ups on file.   Ortho Exam  Patient is alert, oriented, no adenopathy, well-dressed, normal affect, normal respiratory effort. Small piece of surgical suture was removed from underneath the toe.  Swelling is well controlled she still has some sensitivity but this is improving no cellulitis no drainage no foul odor  Imaging: No results found.   Labs: Lab Results  Component Value Date   REPTSTATUS 06/18/2018 FINAL 06/15/2018   GRAMSTAIN  06/15/2018    NO WBC SEEN NO ORGANISMS SEEN Performed at [facility], [address], [place], [place] [address]    CULT RARE STAPHYLOCOCCUS AUREUS 06/15/2018   LABORGA STAPHYLOCOCCUS AUREUS 06/15/2018     Lab Results  Component Value Date   ALBUMIN 3.9 04/26/2013   ALBUMIN 2.6 (L) 11/01/2010   ALBUMIN 3.7 03/11/2010    No results found for: MG No results found for: VD25OH  No results found for: PREALBUMIN CBC EXTENDED Latest Ref  Rng & Units 01/31/2016 01/29/2016 06/17/2015  WBC 4.0 - 10.5 K/uL 9.7 7.4 10.5  RBC 3.87 - 5.11 MIL/uL 3.52(L) 4.23 4.08  HGB 12.0 - 15.0 g/dL 10.6(L) 12.9 12.4  HCT 36.0 - 46.0 % 31.2(L) 37.5 36.9  PLT 150 - 400 K/uL 175 202 230  NEUTROABS 1.7 - 7.7 K/uL - - -  LYMPHSABS 0.7 - 4.0 K/uL - - -     Body mass index is 46.48 kg/m.  Orders:  Orders Placed This Encounter  Procedures  . XR Foot 2 Views Right   No orders of the defined types were placed in this encounter.    Procedures: No procedures performed  Clinical Data: No additional findings.  ROS:  All other systems negative, except as noted in the HPI. Review of Systems  Objective: Vital Signs: Ht 5\' 1"  (1.549 m)   Wt 246 lb (111.6 kg)   BMI 46.48 kg/m   Specialty Comments:  No specialty comments available.  PMFS History: Patient Active Problem List   Diagnosis Date Noted  . Left wrist pain 03/04/2017  . Wrist swelling, left 03/04/2017  . Encounter for trial of labor 01/29/2016  . Obesity 05/25/2014  . Insertion of implantable subdermal contraceptive 06/03/2013  . Abnormal uterine bleeding (AUB) 05/19/2013   Past  Medical History:  Diagnosis Date  . Abnormal Pap smear    Dr. Jodi Mourning  . Allergic rhinitis   . Contraception management    [facility], Dr. Jodi Mourning  . Dysmenorrhea    on Implanon + OCP per Dr. Jodi Mourning  . GERD (gastroesophageal reflux disease)   . Gestational diabetes mellitus (GDM), antepartum   . Hypertension    hospitalized 11/01/10  . Obesity   . Pulmonary edema 12/11   postpartum, hospitalized;    . Wears glasses     Family History  Problem Relation Age of Onset  . Hypertension Mother   . Fibromyalgia Mother   . Asthma Mother   . Allergic rhinitis Mother   . Other Father        unknown  . Other Sister        died at birth, mother had eclampsia  . Allergic rhinitis Brother   . Other Brother        auditory processing disorder, eating disorder, sleeping disorder  .  Cancer Paternal Grandmother        lung  . Diabetes Paternal Grandmother   . Heart disease Neg Hx     Past Surgical History:  Procedure Laterality Date  . CESAREAN SECTION  01/2008  . CESAREAN SECTION  01/30/2016   Procedure: CESAREAN SECTION;  Surgeon: Janyth Pupa, DO;  Location: [facility];  Service: Obstetrics;;   Social History   Occupational History  . Not on file  Tobacco Use  . Smoking status: Never Smoker  . Smokeless tobacco: Never Used  Substance and Sexual Activity  . Alcohol use: Yes    Alcohol/week: 2.0 standard drinks    Types: 2 Glasses of wine per week    Comment: occasional  . Drug use: No    Frequency: 1.0 times per week  . Sexual activity: Yes    Birth control/protection: OCP

## 2020-04-07 ENCOUNTER — Other Ambulatory Visit: Payer: Self-pay | Admitting: Orthopedic Surgery

## 2020-04-07 NOTE — Telephone Encounter (Signed)
Pt was in the office yesterday do you wish to refill?  

## 2020-05-04 ENCOUNTER — Ambulatory Visit (INDEPENDENT_AMBULATORY_CARE_PROVIDER_SITE_OTHER): Payer: BC Managed Care – PPO | Admitting: Orthopedic Surgery

## 2020-05-04 ENCOUNTER — Encounter: Payer: Self-pay | Admitting: Orthopedic Surgery

## 2020-05-04 ENCOUNTER — Other Ambulatory Visit: Payer: Self-pay

## 2020-05-04 DIAGNOSIS — S91114A Laceration without foreign body of right lesser toe(s) without damage to nail, initial encounter: Secondary | ICD-10-CM | POA: Diagnosis not present

## 2020-05-04 NOTE — Progress Notes (Signed)
Office Visit Note   Patient: Christina Payne           Date of Birth: 1985-12-15           MRN: [id number] Visit Date: 05/04/2020              Requested by: Carlena Hurl, PA-C [address] [place],  [place] [address] PCP: Tomasa Hose, NP  Chief Complaint  Patient presents with  . Right Foot - Follow-up      HPI: Patient is a 34 year old woman who presents in follow-up for laceration right foot fifth toe.  [place]  She is about 2 months out.  Patient states she still has decreased range of motion of all of the lesser toes and has decreased sensation circumferentially around the little toe.  Assessment & Plan: Visit Diagnoses:  1. Laceration of fifth toe of right foot, initial encounter     Plan: Patient will increase her activities as tolerated she has no restrictions.  Anticipate her sensation and range of motion should improve as the swelling resolves.  Follow-Up Instructions: Return if symptoms worsen or fail to improve.   Ortho Exam  Patient is alert, oriented, no adenopathy, well-dressed, normal affect, normal respiratory effort. Examination patient does have swelling of the right foot there is no redness no cellulitis no open wounds no signs of infection she has good range of motion of the great toe good range of motion of the ankle all of the lesser toes have decreased extension she does have decreased flexion as well of all lesser toes.  Subjectively she has decreased sensation circumferentially around the little toe.  Her laceration is completely healed.  Imaging: No results found. No images are attached to the encounter.  Labs: Lab Results  Component Value Date   REPTSTATUS 06/18/2018 FINAL 06/15/2018   GRAMSTAIN  06/15/2018    NO WBC SEEN NO ORGANISMS SEEN Performed at [facility], [place] [address] [place], [place] [address]    CULT RARE STAPHYLOCOCCUS AUREUS 06/15/2018   LABORGA STAPHYLOCOCCUS AUREUS  06/15/2018     Lab Results  Component Value Date   ALBUMIN 3.9 04/26/2013   ALBUMIN 2.6 (L) 11/01/2010   ALBUMIN 3.7 03/11/2010    No results found for: MG No results found for: VD25OH  No results found for: PREALBUMIN CBC EXTENDED Latest Ref Rng & Units 01/31/2016 01/29/2016 06/17/2015  WBC 4.0 - 10.5 K/uL 9.7 7.4 10.5  RBC 3.87 - 5.11 MIL/uL 3.52(L) 4.23 4.08  HGB 12.0 - 15.0 g/dL 10.6(L) 12.9 12.4  HCT 36 - 46 % 31.2(L) 37.5 36.9  PLT 150 - 400 K/uL 175 202 230  NEUTROABS 1.7 - 7.7 K/uL - - -  LYMPHSABS 0.7 - 4.0 K/uL - - -     There is no height or weight on file to calculate BMI.  Orders:  No orders of the defined types were placed in this encounter.  No orders of the defined types were placed in this encounter.    Procedures: No procedures performed  Clinical Data: No additional findings.  ROS:  All other systems negative, except as noted in the HPI. Review of Systems  Objective: Vital Signs: There were no vitals taken for this visit.  Specialty Comments:  No specialty comments available.  PMFS History: Patient Active Problem List   Diagnosis Date Noted  . Left wrist pain 03/04/2017  . Wrist swelling, left 03/04/2017  . Encounter for trial of labor  01/29/2016  . Obesity 05/25/2014  . Insertion of implantable subdermal contraceptive 06/03/2013  . Abnormal uterine bleeding (AUB) 05/19/2013   Past Medical History:  Diagnosis Date  . Abnormal Pap smear    Dr. Clearance Coots  . Allergic rhinitis   . Contraception management    [facility], Dr. Clearance Coots  . Dysmenorrhea    on Implanon + OCP per Dr. Clearance Coots  . GERD (gastroesophageal reflux disease)   . Gestational diabetes mellitus (GDM), antepartum   . Hypertension    hospitalized 11/01/10  . Obesity   . Pulmonary edema 12/11   postpartum, hospitalized;    . Wears glasses     Family History  Problem Relation Age of Onset  . Hypertension Mother   . Fibromyalgia Mother   . Asthma Mother   .  Allergic rhinitis Mother   . Other Father        unknown  . Other Sister        died at birth, mother had eclampsia  . Allergic rhinitis Brother   . Other Brother        auditory processing disorder, eating disorder, sleeping disorder  . Cancer Paternal Grandmother        lung  . Diabetes Paternal Grandmother   . Heart disease Neg Hx     Past Surgical History:  Procedure Laterality Date  . CESAREAN SECTION  01/2008  . CESAREAN SECTION  01/30/2016   Procedure: CESAREAN SECTION;  Surgeon: Myna Hidalgo, DO;  Location: [facility];  Service: Obstetrics;;   Social History   Occupational History  . Not on file  Tobacco Use  . Smoking status: Never Smoker  . Smokeless tobacco: Never Used  Substance and Sexual Activity  . Alcohol use: Yes    Alcohol/week: 2.0 standard drinks    Types: 2 Glasses of wine per week    Comment: occasional  . Drug use: No    Frequency: 1.0 times per week  . Sexual activity: Yes    Birth control/protection: OCP

## 2020-11-15 ENCOUNTER — Other Ambulatory Visit: Payer: BC Managed Care – PPO

## 2020-11-15 ENCOUNTER — Other Ambulatory Visit: Payer: Self-pay

## 2020-11-15 DIAGNOSIS — Z20822 Contact with and (suspected) exposure to covid-19: Secondary | ICD-10-CM

## 2020-11-16 LAB — NOVEL CORONAVIRUS, NAA: SARS-CoV-2, NAA: NOT DETECTED

## 2020-11-16 LAB — SARS-COV-2, NAA 2 DAY TAT

## 2020-11-30 ENCOUNTER — Other Ambulatory Visit: Payer: Self-pay

## 2020-11-30 ENCOUNTER — Ambulatory Visit (INDEPENDENT_AMBULATORY_CARE_PROVIDER_SITE_OTHER): Payer: BC Managed Care – PPO | Admitting: Cardiovascular Disease

## 2020-11-30 ENCOUNTER — Encounter: Payer: Self-pay | Admitting: Cardiovascular Disease

## 2020-11-30 DIAGNOSIS — I10 Essential (primary) hypertension: Secondary | ICD-10-CM

## 2020-11-30 HISTORY — DX: Essential (primary) hypertension: I10

## 2020-11-30 HISTORY — DX: Morbid (severe) obesity due to excess calories: E66.01

## 2020-11-30 NOTE — Patient Instructions (Addendum)
Medication Instructions:  DECREASE YOUR IBUPROFEN INTAKE   Labwork: CMET/TSH/FT4/CBC/MAGNESIUM TODAY    Testing/Procedures: NONE   Follow-Up: 03/07/2021 AT 8:00 AM WITH DR Eden Roc    You will receive a phone call from the PRE[facility] exercise and nutrition program to schedule an initial assessment.   Special Instructions:   MONITOR YOUR BLOOD PRESSURE TWICE A DAY, LOG IN THE BOOK PROVIDED. BRING THE BOOK AND YOUR BLOOD PRESSURE MACHINE TO YOUR FOLLOW UP IN 1 MONTH   Dr. Dorothyann Peng  LIMIT YOUR SALT/SODIUM INTAKE TO 1,500 MG DAILY   TRY TO EXERCISE 150 MINUTES EACH WEEK   DASH Eating Plan DASH stands for "Dietary Approaches to Stop Hypertension." The DASH eating plan is a healthy eating plan that has been shown to reduce high blood pressure (hypertension). It may also reduce your risk for type 2 diabetes, heart disease, and stroke. The DASH eating plan may also help with weight loss. What are tips for following this plan?  General guidelines  Avoid eating more than 2,300 mg (milligrams) of salt (sodium) a day. If you have hypertension, you may need to reduce your sodium intake to 1,500 mg a day.  Limit alcohol intake to no more than 1 drink a day for nonpregnant women and 2 drinks a day for men. One drink equals 12 oz of beer, 5 oz of wine, or 1 oz of hard liquor.  Work with your health care provider to maintain a healthy body weight or to lose weight. Ask what an ideal weight is for you.  Get at least 30 minutes of exercise that causes your heart to beat faster (aerobic exercise) most days of the week. Activities may include walking, swimming, or biking.  Work with your health care provider or diet and nutrition specialist (dietitian) to adjust your eating plan to your individual calorie needs. Reading food labels   Check food labels for the amount of sodium per serving. Choose foods with less than 5 percent of the Daily Value of sodium. Generally, foods with less than 300 mg  of sodium per serving fit into this eating plan.  To find whole grains, look for the word "whole" as the first word in the ingredient list. Shopping  Buy products labeled as "low-sodium" or "no salt added."  Buy fresh foods. Avoid canned foods and premade or frozen meals. Cooking  Avoid adding salt when cooking. Use salt-free seasonings or herbs instead of table salt or sea salt. Check with your health care provider or pharmacist before using salt substitutes.  Do not fry foods. Cook foods using healthy methods such as baking, boiling, grilling, and broiling instead.  Cook with heart-healthy oils, such as olive, canola, soybean, or sunflower oil. Meal planning  Eat a balanced diet that includes: ? 5 or more servings of fruits and vegetables each day. At each meal, try to fill half of your plate with fruits and vegetables. ? Up to 6-8 servings of whole grains each day. ? Less than 6 oz of lean meat, poultry, or fish each day. A 3-oz serving of meat is about the same size as a deck of cards. One egg equals 1 oz. ? 2 servings of low-fat dairy each day. ? A serving of nuts, seeds, or beans 5 times each week. ? Heart-healthy fats. Healthy fats called Omega-3 fatty acids are found in foods such as flaxseeds and coldwater fish, like sardines, salmon, and mackerel.  Limit how much you eat of the following: ? Canned or prepackaged foods. ?  Food that is high in trans fat, such as fried foods. ? Food that is high in saturated fat, such as fatty meat. ? Sweets, desserts, sugary drinks, and other foods with added sugar. ? Full-fat dairy products.  Do not salt foods before eating.  Try to eat at least 2 vegetarian meals each week.  Eat more home-cooked food and less restaurant, buffet, and fast food.  When eating at a restaurant, ask that your food be prepared with less salt or no salt, if possible. What foods are recommended? The items listed may not be a complete list. Talk with your  dietitian about what dietary choices are best for you. Grains Whole-grain or whole-wheat bread. Whole-grain or whole-wheat pasta. Brown rice. [place]. Bulgur. Whole-grain and low-sodium cereals. Pita bread. Low-fat, low-sodium crackers. Whole-wheat flour tortillas. Vegetables Fresh or frozen vegetables (raw, steamed, roasted, or grilled). Low-sodium or reduced-sodium tomato and vegetable juice. Low-sodium or reduced-sodium tomato sauce and tomato paste. Low-sodium or reduced-sodium canned vegetables. Fruits All fresh, dried, or frozen fruit. Canned fruit in natural juice (without added sugar). Meat and other protein foods Skinless chicken or [place]. Ground chicken or [place]. Pork with fat trimmed off. Fish and seafood. Egg whites. Dried beans, peas, or lentils. Unsalted nuts, nut butters, and seeds. Unsalted canned beans. Lean cuts of beef with fat trimmed off. Low-sodium, lean deli meat. Dairy Low-fat (1%) or fat-free (skim) milk. Fat-free, low-fat, or reduced-fat cheeses. Nonfat, low-sodium ricotta or cottage cheese. Low-fat or nonfat yogurt. Low-fat, low-sodium cheese. Fats and oils Soft margarine without trans fats. Vegetable oil. Low-fat, reduced-fat, or light mayonnaise and salad dressings (reduced-sodium). Canola, safflower, olive, soybean, and sunflower oils. Avocado. Seasoning and other foods Herbs. Spices. Seasoning mixes without salt. Unsalted popcorn and pretzels. Fat-free sweets. What foods are not recommended? The items listed may not be a complete list. Talk with your dietitian about what dietary choices are best for you. Grains Baked goods made with fat, such as croissants, muffins, or some breads. Dry pasta or rice meal packs. Vegetables Creamed or fried vegetables. Vegetables in a cheese sauce. Regular canned vegetables (not low-sodium or reduced-sodium). Regular canned tomato sauce and paste (not low-sodium or reduced-sodium). Regular tomato and vegetable juice (not  low-sodium or reduced-sodium). Angie Fava. Olives. Fruits Canned fruit in a light or heavy syrup. Fried fruit. Fruit in cream or butter sauce. Meat and other protein foods Fatty cuts of meat. Ribs. Fried meat. Berniece Salines. Sausage. Bologna and other processed lunch meats. Salami. Fatback. Hotdogs. Bratwurst. Salted nuts and seeds. Canned beans with added salt. Canned or smoked fish. Whole eggs or egg yolks. Chicken or [place] with skin. Dairy Whole or 2% milk, cream, and half-and-half. Whole or full-fat cream cheese. Whole-fat or sweetened yogurt. Full-fat cheese. Nondairy creamers. Whipped toppings. Processed cheese and cheese spreads. Fats and oils Butter. Stick margarine. Lard. Shortening. Ghee. Bacon fat. Tropical oils, such as coconut, palm kernel, or palm oil. Seasoning and other foods Salted popcorn and pretzels. Onion salt, garlic salt, seasoned salt, table salt, and sea salt. Worcestershire sauce. Tartar sauce. Barbecue sauce. Teriyaki sauce. Soy sauce, including reduced-sodium. Steak sauce. Canned and packaged gravies. Fish sauce. Oyster sauce. Cocktail sauce. Horseradish that you find on the shelf. Ketchup. Mustard. Meat flavorings and tenderizers. Bouillon cubes. Hot sauce and Tabasco sauce. Premade or packaged marinades. Premade or packaged taco seasonings. Relishes. Regular salad dressings. Where to find more information:  National Heart, Lung, and [place]: https://wilson-eaton.com/  American Heart Association: www.heart.org Summary  The DASH eating plan is  a healthy eating plan that has been shown to reduce high blood pressure (hypertension). It may also reduce your risk for type 2 diabetes, heart disease, and stroke.  With the DASH eating plan, you should limit salt (sodium) intake to 2,300 mg a day. If you have hypertension, you may need to reduce your sodium intake to 1,500 mg a day.  When on the DASH eating plan, aim to eat more fresh fruits and vegetables, whole grains, lean proteins,  low-fat dairy, and heart-healthy fats.  Work with your health care provider or diet and nutrition specialist (dietitian) to adjust your eating plan to your individual calorie needs. This information is not intended to replace advice given to you by your health care provider. Make sure you discuss any questions you have with your health care provider. Document Released: 10/17/2011 Document Revised: 10/10/2017 Document Reviewed: 10/21/2016 Elsevier Patient Education  2020 [employer].

## 2020-11-30 NOTE — Progress Notes (Signed)
Cardiology Office Note   Date:  11/30/2020   ID:  Christina Payne, DOB May 11, 1986, MRN [id number]  PCP:  Jettie Pagan, NP  Cardiologist:   Chilton Si, MD   No chief complaint on file.   History of Present Illness: Christina Payne is a 35 y.o. female with GERD, hypertension, gestational diabetes, and obesity who is being seen today for the evaluation of hypertension at the request of Maxie Better, MD.  She had pre-eclampsia after her second child (2012) that required hospitalization.  The hypertension resolved postpartum.  Last year she had wisdom tooth pain and her BP has been elevated since then.  It was 185/96.  She saw her PCP and her BP remained high so she was started on amlodipine.  She saw Dr. Cherly Hensen on 12/7 and her BP was 140/88 on amlodipine.  Her OCP was disconitnued and she was referred for weight loss.  She has been considering having another child.  She started taking BP medication about one year ago.  At home her blood pressures been in the 120s over 80s to 90s.  She is constantly on her feet at work and has no exertional chest pain or shortness of breath.  She does not get much exercise outside of work.  She does have some swelling in her right leg.  She broke her foot last year.  She struggles with salt intake.  She both eats out and cooks.  When cooking she does limit salt intake.  She does not drink caffeine because it causes palpitations.  She drinks wine once or twice per week.  Her only over-the-counter supplements are an amino acid, multivitamin, Seema's, elderberry, and flaxseed oil.  She does use ibuprofen regularly because of pain since her foot injury.  She snores but has no apnea.  She sometimes feels rested.  She has no daytime somnolence.   She has palpitations whenever she drinks caffeine.   Past Medical History:  Diagnosis Date  . Abnormal Pap smear    Dr. Clearance Coots  . Allergic rhinitis   . Contraception management    [facility], Dr. Clearance Coots  . Dysmenorrhea    on Implanon + OCP per Dr. Clearance Coots  . Essential hypertension 11/30/2020  . GERD (gastroesophageal reflux disease)   . Gestational diabetes mellitus (GDM), antepartum   . Hypertension    hospitalized 11/01/10  . Morbid obesity (HCC) 11/30/2020  . Obesity   . Pulmonary edema 12/11   postpartum, hospitalized;    . Wears glasses     Past Surgical History:  Procedure Laterality Date  . CESAREAN SECTION  01/2008  . CESAREAN SECTION  01/30/2016   Procedure: CESAREAN SECTION;  Surgeon: Myna Hidalgo, DO;  Location: [facility];  Service: Obstetrics;;     Current Outpatient Medications  Medication Sig Dispense Refill  . amLODipine (NORVASC) 5 MG tablet Take 5 mg by mouth daily.    Marland Kitchen loratadine (CLARITIN) 10 MG tablet Take 10 mg by mouth daily as needed for allergies.     . Prenatal Vit-Fe Fumarate-FA (PRENATAL MULTIVITAMIN) TABS tablet Take 1 tablet by mouth daily at 12 noon.     No current facility-administered medications for this visit.    Allergies:   Patient has no known allergies.    Social History:  The patient  reports that she has never smoked. She has never used smokeless tobacco. She reports current alcohol use of about 2.0 standard drinks of alcohol per week. She reports that she does not  use drugs.   Family History:  The patient's family history includes Allergic rhinitis in her brother and mother; Asthma in her mother; Cancer in her paternal grandmother; Diabetes in her paternal grandmother; Fibromyalgia in her mother; Hypertension in her mother; Other in her brother, father, and sister.    ROS:  Please see the history of present illness.   Otherwise, review of systems are positive for none.   All other systems are reviewed and negative.    PHYSICAL EXAM: VS:  BP 128/78   Pulse (!) 108   Ht 5' 0.05" (1.525 m)   Wt 243 lb (110.2 kg)   BMI 47.38 kg/m  , BMI Body mass index is 47.38 kg/m. GENERAL:  Well appearing HEENT:  Pupils equal  round and reactive, fundi not visualized, oral mucosa unremarkable NECK:  No jugular venous distention, waveform within normal limits, carotid upstroke brisk and symmetric, no bruits LUNGS:  Clear to auscultation bilaterally HEART:  RRR.  PMI not displaced or sustained,S1 and S2 within normal limits, no S3, no S4, no clicks, no rubs, no murmurs ABD:  Flat, positive bowel sounds normal in frequency in pitch, no bruits, no rebound, no guarding, no midline pulsatile mass, no hepatomegaly, no splenomegaly EXT:  2 plus pulses throughout, no edema, no cyanosis no clubbing SKIN:  No rashes no nodules NEURO:  Cranial nerves II through XII grossly intact, motor grossly intact throughout PSYCH:  Cognitively intact, oriented to person place and time   EKG:  EKG is ordered today. The ekg ordered today demonstrates sinus tachycardia.  Rate 108 bpm.   Recent Labs: No results found for requested labs within last 8760 hours.    Lipid Panel    Component Value Date/Time   CHOL 118 04/26/2013 0921   TRIG 100 04/26/2013 0921   HDL 35 (L) 04/26/2013 0921   CHOLHDL 3.4 04/26/2013 0921   VLDL 20 04/26/2013 0921   LDLCALC 63 04/26/2013 0921      Wt Readings from Last 3 Encounters:  11/30/20 243 lb (110.2 kg)  04/06/20 246 lb (111.6 kg)  03/20/20 246 lb (111.6 kg)      ASSESSMENT AND PLAN:  # Essential hypertension: # Morbid obesity: BP is controlled today.  We focused on lifestyle modification.  Her OCP has been discontinued which may have contributed.  Limit ibuprofen intake.  Work on increasing exercise to at least 150 minutes weekly.  Reduce sodium to 1500 mg daily.  She is interested in the PREP exercise program through the [facility].  She is contemplating pregnancy. Will need aspirin for pre-eclampsia prevention.   # Palpitations:  Check CMP, TSH, free T4, CBC, magnesium.  Current medicines are reviewed at length with the patient today.  The patient does not have concerns regarding  medicines.  The following changes have been made:  no change  Labs/ tests ordered today include:   Orders Placed This Encounter  Procedures  . TSH  . T4, free  . CBC with Differential/Platelet  . Comprehensive metabolic panel  . Magnesium  . EKG 12-Lead     Disposition:   FU with Jalesia Loudenslager C. Duke Salvia, MD, [facility] in 3 months      Signed, Gigi Onstad C. Duke Salvia, MD, [facility]  11/30/2020 6:05 PM    [facility]

## 2020-12-01 ENCOUNTER — Telehealth: Payer: Self-pay

## 2020-12-01 LAB — COMPREHENSIVE METABOLIC PANEL
ALT: 17 IU/L (ref 0–32)
AST: 15 IU/L (ref 0–40)
Albumin/Globulin Ratio: 1.3 (ref 1.2–2.2)
Albumin: 4.2 g/dL (ref 3.8–4.8)
Alkaline Phosphatase: 107 IU/L (ref 44–121)
BUN/Creatinine Ratio: 16 (ref 9–23)
BUN: 11 mg/dL (ref 6–20)
Bilirubin Total: <0.2 mg/dL (ref 0.0–1.2)
CO2: 24 mmol/L (ref 20–29)
Calcium: 9.2 mg/dL (ref 8.7–10.2)
Chloride: 101 mmol/L (ref 96–106)
Creatinine, Ser: 0.69 mg/dL (ref 0.57–1.00)
GFR calc Af Amer: 131 mL/min/{1.73_m2} (ref >59)
GFR calc non Af Amer: 114 mL/min/{1.73_m2} (ref >59)
Globulin, Total: 3.3 g/dL (ref 1.5–4.5)
Glucose: 108 mg/dL — ABNORMAL HIGH (ref 65–99)
Potassium: 4.2 mmol/L (ref 3.5–5.2)
Sodium: 138 mmol/L (ref 134–144)
Total Protein: 7.5 g/dL (ref 6.0–8.5)

## 2020-12-01 LAB — CBC WITH DIFFERENTIAL/PLATELET
Basophils Absolute: 0.1 10*3/uL (ref 0.0–0.2)
Basos: 1 %
EOS (ABSOLUTE): 0.5 10*3/uL — ABNORMAL HIGH (ref 0.0–0.4)
Eos: 6 %
Hematocrit: 38.2 % (ref 34.0–46.6)
Hemoglobin: 13 g/dL (ref 11.1–15.9)
Immature Grans (Abs): 0 10*3/uL (ref 0.0–0.1)
Immature Granulocytes: 1 %
Lymphocytes Absolute: 2 10*3/uL (ref 0.7–3.1)
Lymphs: 24 %
MCH: 29.4 pg (ref 26.6–33.0)
MCHC: 34 g/dL (ref 31.5–35.7)
MCV: 86 fL (ref 79–97)
Monocytes Absolute: 0.6 10*3/uL (ref 0.1–0.9)
Monocytes: 8 %
Neutrophils Absolute: 5.1 10*3/uL (ref 1.4–7.0)
Neutrophils: 60 %
Platelets: 286 10*3/uL (ref 150–450)
RBC: 4.42 x10E6/uL (ref 3.77–5.28)
RDW: 11.8 % (ref 11.7–15.4)
WBC: 8.3 10*3/uL (ref 3.4–10.8)

## 2020-12-01 LAB — MAGNESIUM: Magnesium: 2.1 mg/dL (ref 1.6–2.3)

## 2020-12-01 LAB — T4, FREE: Free T4: 1.31 ng/dL (ref 0.82–1.77)

## 2020-12-01 LAB — TSH: TSH: 1.08 u[IU]/mL (ref 0.450–4.500)

## 2020-12-01 NOTE — Telephone Encounter (Signed)
LVMT pt reference referral to PREP Request call back to discuss

## 2020-12-01 NOTE — Telephone Encounter (Signed)
Call from pt (VM) Returned call Explained PREP Can do an evening class at [facility] to tentative start date 2/15 T/TH 615p-730p Will call back at a closer time to do intake assessment

## 2020-12-21 ENCOUNTER — Telehealth: Payer: Self-pay

## 2020-12-27 NOTE — Progress Notes (Signed)
[facility] PREP Progress Report   Patient Details  Name: Christina Payne MRN: [id number] Date of Birth: 22-Jan-1986 Age: 35 y.o. PCP: Jettie Pagan, NP  Vitals:   12/27/20 1602  BP: (!) 144/90  Pulse: 72  Weight: 246 lb (111.6 kg)  Height: 5' (1.524 m)      [facility] Eval - 12/27/20 1600      Referral    Referring Provider Duke Salvia    Reason for referral Hypertension    Program Start Date 01/02/21   T/TH 615p-730pm     Measurement   Waist Circumference 45 inches    Hip Circumference 53 inches    Body fat 47.2 percent      Mobility and Daily Activities   I find it easy to walk up or down two or more flights of stairs. 2    I have no trouble taking out the trash. 4    I do housework such as vacuuming and dusting on my own without difficulty. 4    I can easily lift a gallon of milk (8lbs). 4    I can easily walk a mile. 2    I have no trouble reaching into high cupboards or reaching down to pick up something from the floor. 4    I do not have trouble doing out-door work such as Loss adjuster, chartered, raking leaves, or gardening. 4      Mobility and Daily Activities   I feel younger than my age. 3    I feel independent. 4    I feel energetic. 2    I live an active life.  1    I feel strong. 4    I feel healthy. 2    I feel active as other people my age. 1      How fit and strong are you.   Fit and Strong Total Score 41          Past Medical History:  Diagnosis Date  . Abnormal Pap smear    Dr. Clearance Coots  . Allergic rhinitis   . Contraception management    [facility], Dr. Clearance Coots  . Dysmenorrhea    on Implanon + OCP per Dr. Clearance Coots  . Essential hypertension 11/30/2020  . GERD (gastroesophageal reflux disease)   . Gestational diabetes mellitus (GDM), antepartum   . Hypertension    hospitalized 11/01/10  . Morbid obesity (HCC) 11/30/2020  . Obesity   . Pulmonary edema 12/11   postpartum, hospitalized;    . Wears glasses    Past Surgical History:   Procedure Laterality Date  . CESAREAN SECTION  01/2008  . CESAREAN SECTION  01/30/2016   Procedure: CESAREAN SECTION;  Surgeon: Myna Hidalgo, DO;  Location: [facility];  Service: Obstetrics;;   Social History   Tobacco Use  Smoking Status Never Smoker  Smokeless Tobacco Never Used       Bonnye Fava 12/27/2020, 4:06 PM

## 2021-01-10 NOTE — Progress Notes (Signed)
[facility] PREP Weekly Session   Patient Details  Name: Africa Masaki MRN: [id number] Date of Birth: 05-May-1986 Age: 35 y.o. PCP: Jettie Pagan, NP  There were no vitals filed for this visit.   [facility] Weekly seesion - 01/10/21 1500      Weekly Session   Topic Discussed Importance of resistance training;Other ways to be active    Minutes exercised this week 15 minutes    Classes attended to date 3            Damisha Wolff B Greogory Cornette 01/10/2021, 3:04 PM

## 2021-01-17 NOTE — Progress Notes (Signed)
[facility] PREP Weekly Session   Patient Details  Name: Christina Payne MRN: [id number] Date of Birth: 01/26/1986 Age: 35 y.o. PCP: Jettie Pagan, NP  There were no vitals filed for this visit.   [facility] Weekly seesion - 01/17/21 0900      Weekly Session   Topic Discussed Healthy eating tips    Minutes exercised this week 45 minutes    Classes attended to date 5            [name] [name] 01/17/2021, 9:54 AM

## 2021-01-24 NOTE — Progress Notes (Signed)
[facility] PREP Weekly Session   Patient Details  Name: Christina Payne MRN: [id number] Date of Birth: 1986-05-24 Age: 35 y.o. PCP: Jettie Pagan, NP  There were no vitals filed for this visit.   [facility] Weekly seesion - 01/24/21 1100      Weekly Session   Topic Discussed Health habits    Minutes exercised this week 40 minutes    Classes attended to date 6            [name] [name] 01/24/2021, 11:53 AM

## 2021-01-31 NOTE — Progress Notes (Signed)
[facility] PREP Weekly Session   Patient Details  Name: Christina Payne MRN: [id number] Date of Birth: [birth year] Age: 35 y.o. PCP: Jettie Pagan, NP  Vitals:   01/30/21 1815  Weight: 241 lb (109.3 kg)     [facility] Weekly seesion - 01/31/21 1200      Weekly Session   Topic Discussed Restaurant Eating   Salt discussion/demo   Minutes exercised this week 110 minutes    Classes attended to date 8            Brynden Thune B Alphonzo Devera 01/31/2021, 12:24 PM

## 2021-02-21 NOTE — Progress Notes (Signed)
[facility] PREP Weekly Session   Patient Details  Name: Aleasha Fregeau MRN: [id number] Date of Birth: 1986/06/15 Age: 35 y.o. PCP: Jettie Pagan, NP  Vitals:   02/20/21 1830  Weight: 240 lb (108.9 kg)     [facility] Weekly seesion - 02/21/21 1100      Weekly Session   Topic Discussed Other   Food portions   Minutes exercised this week 120 minutes    Classes attended to date 9          PREP class 02/20/21   Bonnye Fava 02/21/2021, 11:49 AM

## 2021-03-07 ENCOUNTER — Ambulatory Visit (INDEPENDENT_AMBULATORY_CARE_PROVIDER_SITE_OTHER): Payer: BC Managed Care – PPO | Admitting: Cardiovascular Disease

## 2021-03-07 ENCOUNTER — Encounter: Payer: Self-pay | Admitting: Cardiovascular Disease

## 2021-03-07 ENCOUNTER — Other Ambulatory Visit: Payer: Self-pay

## 2021-03-07 VITALS — BP 128/30 | HR 84 | Ht 60.0 in | Wt 247.4 lb

## 2021-03-07 DIAGNOSIS — I10 Essential (primary) hypertension: Secondary | ICD-10-CM | POA: Diagnosis not present

## 2021-03-07 DIAGNOSIS — R0602 Shortness of breath: Secondary | ICD-10-CM

## 2021-03-07 DIAGNOSIS — R609 Edema, unspecified: Secondary | ICD-10-CM

## 2021-03-07 HISTORY — DX: Shortness of breath: R06.02

## 2021-03-07 NOTE — Assessment & Plan Note (Signed)
Blood pressure is much better controlled.  Continue diet and exercise efforts.  She was encouraged to continue plant-based diet and to continue limiting sodium intake.

## 2021-03-07 NOTE — Patient Instructions (Signed)
Medication Instructions:  Your physician recommends that you continue on your current medications as directed. Please refer to the Current Medication list given to you today.   Labwork: NONE  Testing/Procedures: Your physician has requested that you have an echocardiogram. Echocardiography is a painless test that uses sound waves to create images of your heart. It provides your doctor with information about the size and shape of your heart and how well your heart's chambers and valves are working. This procedure takes approximately one hour. There are no restrictions for this procedure. [facility] AT [address]   Follow-Up: Your physician wants you to follow-up in: 6 MONTHS AT [facility]  You will receive a reminder letter in the mail two months in advance. If you don't receive a letter, please call our office to schedule the follow-up appointment.

## 2021-03-07 NOTE — Assessment & Plan Note (Signed)
She was congratulated on her diet and exercise efforts.  She was encouraged to keep it up.  She has a physical scheduled next week and will get fasting lipids and a hemoglobin A1c at that time.

## 2021-03-07 NOTE — Progress Notes (Signed)
Advanced Hypertension Clinic Note   Date:  03/07/2021   ID:  Melizza Kanode, DOB August 05, 1986, MRN [id number]  PCP:  Jettie Pagan, NP  Cardiologist:   Chilton Si, MD   No chief complaint on file.   History of Present Illness: Christina Payne is a 35 y.o. female with GERD, hypertension, gestational diabetes, and obesity here for follow-up.  She was initially seen in advanced hypertension clinic 11/2020.  She had pre-eclampsia after her second child (2012) that required hospitalization.  The hypertension resolved postpartum.  Last year she had wisdom tooth pain and her BP has been elevated since then.  It was 185/96.  She saw her PCP and her BP remained high so she was started on amlodipine.  She saw Dr. Cherly Hensen on 12/7 and her BP was 140/88 on amlodipine.  Her OCP was disconitnued and she was referred for weight loss.  She has been considering having another child.  She started taking BP medication about one year ago.  At her initial visit her blood pressure is actually well-controlled she wanted to focus on lifestyle modification.  She was continued on amlodipine and referred to the PR EP program to the [facility].    Her blood pressure ranges in the 120-130s in the morning and 120-130s in the afternoon. She mentions taking her blood pressure one day and the reading showed 111/78, she notes having some concern but denies having any adverse symptoms. She reports joining [employer] 2 weeks ago and she is more consistent. She reports that her weight continues to fluctuate. Before her menstrual cycle she craves sugars and salts, which is her weakness. However this only lasts for a week and then resolves.  She is now followng a plant-base diet. She has some chest tightness and palpitations while completing formal exercise but after taking deep breaths and raising her head over her head the symptoms improve.  She is noting improvement in her exertion tolerance.  She has some bilateral LE  edema after working out but it resolves in the morning. She is still considering having a baby in th future, if she is able to lose weight. She denies having any abdominal pain, chest pain at rest, PND, orthopnea, lightheadedness, or dizziness at this time.   Past Medical History:  Diagnosis Date  . Abnormal Pap smear    Dr. Clearance Coots  . Allergic rhinitis   . Contraception management    [facility], Dr. Clearance Coots  . Dysmenorrhea    on Implanon + OCP per Dr. Clearance Coots  . Essential hypertension 11/30/2020  . GERD (gastroesophageal reflux disease)   . Gestational diabetes mellitus (GDM), antepartum   . Hypertension    hospitalized 11/01/10  . Morbid obesity (HCC) 11/30/2020  . Obesity   . Pulmonary edema 12/11   postpartum, hospitalized;    . Shortness of breath 03/07/2021  . Wears glasses     Past Surgical History:  Procedure Laterality Date  . CESAREAN SECTION  01/2008  . CESAREAN SECTION  01/30/2016   Procedure: CESAREAN SECTION;  Surgeon: Myna Hidalgo, DO;  Location: [facility];  Service: Obstetrics;;     Current Outpatient Medications  Medication Sig Dispense Refill  . amLODipine (NORVASC) 5 MG tablet Take 5 mg by mouth daily.    Marland Kitchen loratadine (CLARITIN) 10 MG tablet Take 10 mg by mouth daily as needed for allergies.     . Prenatal Vit-Fe Fumarate-FA (PRENATAL MULTIVITAMIN) TABS tablet Take 1 tablet by mouth daily at 12 noon.  No current facility-administered medications for this visit.    Allergies:   Patient has no known allergies.    Social History:  The patient  reports that she has never smoked. She has never used smokeless tobacco. She reports current alcohol use of about 2.0 standard drinks of alcohol per week. She reports that she does not use drugs.   Family History:  The patient's family history includes Allergic rhinitis in her brother and mother; Asthma in her mother; Cancer in her paternal grandmother; Diabetes in her paternal grandmother; Fibromyalgia in her  mother; Hypertension in her mother; Other in her brother, father, and sister.    ROS:  Please see the history of present illness.   Otherwise, review of systems are positive for none.   All other systems are reviewed and negative.    PHYSICAL EXAM: VS:  BP (!) 128/30   Pulse 84   Ht 5' (1.524 m)   Wt 247 lb 6.4 oz (112.2 kg)   BMI 48.32 kg/m  , BMI Body mass index is 48.32 kg/m. GENERAL:  Well appearing HEENT:  Pupils equal round and reactive, fundi not visualized, oral mucosa unremarkable NECK:  No jugular venous distention, waveform within normal limits, carotid upstroke brisk and symmetric, no bruits LUNGS:  Clear to auscultation bilaterally HEART:  RRR.  PMI not displaced or sustained,S1 and S2 within normal limits, no S3, no S4, no clicks, no rubs, no murmurs ABD:  Flat, positive bowel sounds normal in frequency in pitch, no bruits, no rebound, no guarding, no midline pulsatile mass, no hepatomegaly, no splenomegaly EXT:  2 plus pulses throughout, no edema, no cyanosis no clubbing SKIN:  No rashes no nodules NEURO:  Cranial nerves II through XII grossly intact, motor grossly intact throughout PSYCH:  Cognitively intact, oriented to person place and time   EKG:   03/07/21-EKG was not ordered today. 11/30/20-The ekg ordered today demonstrates sinus tachycardia.  Rate 108 bpm.   Recent Labs: 11/30/2020: ALT 17; BUN 11; Creatinine, Ser 0.69; Hemoglobin 13.0; Magnesium 2.1; Platelets 286; Potassium 4.2; Sodium 138; TSH 1.080    Lipid Panel    Component Value Date/Time   CHOL 118 04/26/2013 0921   TRIG 100 04/26/2013 0921   HDL 35 (L) 04/26/2013 0921   CHOLHDL 3.4 04/26/2013 0921   VLDL 20 04/26/2013 0921   LDLCALC 63 04/26/2013 0921      Wt Readings from Last 3 Encounters:  03/07/21 247 lb 6.4 oz (112.2 kg)  02/20/21 240 lb (108.9 kg)  01/30/21 241 lb (109.3 kg)      ASSESSMENT AND PLAN:  Shortness of breath She notes shortness of breath with exertion and has some  lower extremity edema that resolves with elevation of her legs.  Given that her mother was also recently diagnosed with heart failure, we will get an echocardiogram to better assess.  Morbid obesity (HCC) She was congratulated on her diet and exercise efforts.  She was encouraged to keep it up.  She has a physical scheduled next week and will get fasting lipids and a hemoglobin A1c at that time.  Essential hypertension Blood pressure is much better controlled.  Continue diet and exercise efforts.  She was encouraged to continue plant-based diet and to continue limiting sodium intake.    Current medicines are reviewed at length with the patient today.  The patient does not have concerns regarding medicines.  The following changes have been made:  no change  Labs/ tests ordered today include:   Orders  Placed This Encounter  Procedures  . ECHOCARDIOGRAM COMPLETE    Disposition:   FU with Josejuan Hoaglin C. Duke Salvia, MD, [facility] in 6 months     I,Alexis Bryant,acting as a Neurosurgeon for Chilton Si, MD.,have documented all relevant documentation on the behalf of Chilton Si, MD,as directed by  Chilton Si, MD while in the presence of Chilton Si, MD.  Molli Knock  Signed, Sruti Ayllon C. Duke Salvia, MD, [facility]  03/07/2021 8:45 AM    [facility]

## 2021-03-07 NOTE — Progress Notes (Signed)
[facility] PREP Weekly Session   Patient Details  Name: Analiah Drum MRN: [id number] Date of Birth: 1986-06-05 Age: 35 y.o. PCP: Jettie Pagan, NP  There were no vitals filed for this visit.   [facility] Weekly seesion - 03/07/21 1100      Weekly Session   Topic Discussed Calorie breakdown    Minutes exercised this week 180 minutes   stretching twice a day   Classes attended to date 10            Barba Solt B Cuahutemoc Attar 03/07/2021, 11:33 AM

## 2021-03-07 NOTE — Progress Notes (Deleted)
Cardiology Office Note   Date:  03/07/2021   ID:  James Lafalce, DOB 1986-03-16, MRN [id number]  PCP:  Jettie Pagan, NP  Cardiologist:   Chilton Si, MD   No chief complaint on file.   History of Present Illness: Christina Payne is a 35 y.o. female with GERD, hypertension, gestational diabetes, and obesity here for follow-up.  She was initially seen in advanced hypertension clinic 11/2020.  She had pre-eclampsia after her second child (2012) that required hospitalization.  The hypertension resolved postpartum.  Last year she had wisdom tooth pain and her BP has been elevated since then.  It was 185/96.  She saw her PCP and her BP remained high so she was started on amlodipine.  She saw Dr. Cherly Hensen on 12/7 and her BP was 140/88 on amlodipine.  Her OCP was disconitnued and she was referred for weight loss.  She has been considering having another child.  She started taking BP medication about one year ago.  At her initial visit her blood pressure is actually well-controlled she wanted to focus on lifestyle modification.  She was continued on amlodipine and referred to the PR EP program to the [facility].     Past Medical History:  Diagnosis Date  . Abnormal Pap smear    Dr. Clearance Coots  . Allergic rhinitis   . Contraception management    [facility], Dr. Clearance Coots  . Dysmenorrhea    on Implanon + OCP per Dr. Clearance Coots  . Essential hypertension 11/30/2020  . GERD (gastroesophageal reflux disease)   . Gestational diabetes mellitus (GDM), antepartum   . Hypertension    hospitalized 11/01/10  . Morbid obesity (HCC) 11/30/2020  . Obesity   . Pulmonary edema 12/11   postpartum, hospitalized;    . Wears glasses     Past Surgical History:  Procedure Laterality Date  . CESAREAN SECTION  01/2008  . CESAREAN SECTION  01/30/2016   Procedure: CESAREAN SECTION;  Surgeon: Myna Hidalgo, DO;  Location: [facility];  Service: Obstetrics;;     Current Outpatient Medications  Medication  Sig Dispense Refill  . amLODipine (NORVASC) 5 MG tablet Take 5 mg by mouth daily.    Marland Kitchen loratadine (CLARITIN) 10 MG tablet Take 10 mg by mouth daily as needed for allergies.     . Prenatal Vit-Fe Fumarate-FA (PRENATAL MULTIVITAMIN) TABS tablet Take 1 tablet by mouth daily at 12 noon.     No current facility-administered medications for this visit.    Allergies:   Patient has no known allergies.    Social History:  The patient  reports that she has never smoked. She has never used smokeless tobacco. She reports current alcohol use of about 2.0 standard drinks of alcohol per week. She reports that she does not use drugs.   Family History:  The patient's family history includes Allergic rhinitis in her brother and mother; Asthma in her mother; Cancer in her paternal grandmother; Diabetes in her paternal grandmother; Fibromyalgia in her mother; Hypertension in her mother; Other in her brother, father, and sister.    ROS:  Please see the history of present illness.   Otherwise, review of systems are positive for none.   All other systems are reviewed and negative.    PHYSICAL EXAM: VS:  There were no vitals taken for this visit. , BMI There is no height or weight on file to calculate BMI. GENERAL:  Well appearing HEENT:  Pupils equal round and reactive, fundi not visualized, oral mucosa  unremarkable NECK:  No jugular venous distention, waveform within normal limits, carotid upstroke brisk and symmetric, no bruits LUNGS:  Clear to auscultation bilaterally HEART:  RRR.  PMI not displaced or sustained,S1 and S2 within normal limits, no S3, no S4, no clicks, no rubs, no murmurs ABD:  Flat, positive bowel sounds normal in frequency in pitch, no bruits, no rebound, no guarding, no midline pulsatile mass, no hepatomegaly, no splenomegaly EXT:  2 plus pulses throughout, no edema, no cyanosis no clubbing SKIN:  No rashes no nodules NEURO:  Cranial nerves II through XII grossly intact, motor grossly  intact throughout PSYCH:  Cognitively intact, oriented to person place and time   EKG:  EKG is ordered today. The ekg ordered today demonstrates sinus tachycardia.  Rate 108 bpm.   Recent Labs: 11/30/2020: ALT 17; BUN 11; Creatinine, Ser 0.69; Hemoglobin 13.0; Magnesium 2.1; Platelets 286; Potassium 4.2; Sodium 138; TSH 1.080    Lipid Panel    Component Value Date/Time   CHOL 118 04/26/2013 0921   TRIG 100 04/26/2013 0921   HDL 35 (L) 04/26/2013 0921   CHOLHDL 3.4 04/26/2013 0921   VLDL 20 04/26/2013 0921   LDLCALC 63 04/26/2013 0921      Wt Readings from Last 3 Encounters:  02/20/21 240 lb (108.9 kg)  01/30/21 241 lb (109.3 kg)  12/27/20 246 lb (111.6 kg)      ASSESSMENT AND PLAN:  # Essential hypertension: # Morbid obesity: BP is controlled today.  We focused on lifestyle modification.  Her OCP has been discontinued which may have contributed.  Limit ibuprofen intake.  Work on increasing exercise to at least 150 minutes weekly.  Reduce sodium to 1500 mg daily.  She is interested in the PREP exercise program through the [facility].  She is contemplating pregnancy. Will need aspirin for pre-eclampsia prevention.     # Palpitations:  Check CMP, TSH, free T4, CBC, magnesium.  Current medicines are reviewed at length with the patient today.  The patient does not have concerns regarding medicines.  The following changes have been made:  no change  Labs/ tests ordered today include:   No orders of the defined types were placed in this encounter.    Disposition:   FU with Christina Capito C. Duke Salvia, MD, [facility] in 3 months      Signed, Christina Payne C. Duke Salvia, MD, [facility]  03/07/2021 7:57 AM    [facility]

## 2021-03-07 NOTE — Assessment & Plan Note (Signed)
She notes shortness of breath with exertion and has some lower extremity edema that resolves with elevation of her legs.  Given that her mother was also recently diagnosed with heart failure, we will get an echocardiogram to better assess.

## 2021-04-05 ENCOUNTER — Other Ambulatory Visit: Payer: Self-pay

## 2021-04-05 ENCOUNTER — Ambulatory Visit (HOSPITAL_COMMUNITY): Payer: BC Managed Care – PPO | Attending: Cardiology

## 2021-04-05 DIAGNOSIS — R0602 Shortness of breath: Secondary | ICD-10-CM | POA: Diagnosis present

## 2021-04-05 DIAGNOSIS — I10 Essential (primary) hypertension: Secondary | ICD-10-CM | POA: Diagnosis not present

## 2021-04-05 DIAGNOSIS — R609 Edema, unspecified: Secondary | ICD-10-CM | POA: Insufficient documentation

## 2021-04-05 LAB — ECHOCARDIOGRAM COMPLETE
Area-P 1/2: 3.03 cm2
S' Lateral: 3.1 cm

## 2023-12-18 ENCOUNTER — Other Ambulatory Visit: Payer: Self-pay | Admitting: Obstetrics and Gynecology

## 2024-01-06 ENCOUNTER — Encounter (HOSPITAL_COMMUNITY): Payer: Self-pay | Admitting: Obstetrics and Gynecology

## 2024-01-06 ENCOUNTER — Other Ambulatory Visit: Payer: Self-pay

## 2024-01-06 NOTE — Progress Notes (Signed)
 SDW CALL  Patient was given pre-op instructions over the phone. The opportunity was given for the patient to ask questions. No further questions asked. Patient verbalized understanding of instructions given.   PCP - Dr. Ladora Daniel Cardiologist - denies  PPM/ICD - denies   Chest x-ray - 06/18/23 EKG - DOS Stress Test - denies ECHO - 04/05/21 Cardiac Cath - denies  Sleep Study - denies  No DM  Last dose of GLP1 agonist-  n/a GLP1 instructions: n/a  Blood Thinner Instructions:  n/a Aspirin Instructions: n/a  ERAS Protcol - clears until 0430 PRE-SURGERY Ensure or G2- n/a  COVID TEST- n/a   Anesthesia review: no  Patient denies shortness of breath, fever, cough and chest pain over the phone call   All instructions explained to the patient, with a verbal understanding of the material. Patient agrees to go over the instructions while at home for a better understanding.

## 2024-01-08 ENCOUNTER — Encounter (HOSPITAL_COMMUNITY): Payer: Self-pay | Admitting: Obstetrics and Gynecology

## 2024-01-08 ENCOUNTER — Ambulatory Visit (HOSPITAL_COMMUNITY): Payer: BC Managed Care – PPO | Admitting: Anesthesiology

## 2024-01-08 ENCOUNTER — Other Ambulatory Visit: Payer: Self-pay

## 2024-01-08 ENCOUNTER — Ambulatory Visit (HOSPITAL_COMMUNITY)
Admission: RE | Admit: 2024-01-08 | Discharge: 2024-01-08 | Disposition: A | Discharge status: Home / Self Care | Payer: BC Managed Care – PPO | Attending: Obstetrics and Gynecology | Admitting: Obstetrics and Gynecology

## 2024-01-08 ENCOUNTER — Encounter (HOSPITAL_COMMUNITY)
Admission: RE | Disposition: A | Discharge status: Home / Self Care | Payer: Self-pay | Source: Home / Self Care | Attending: Obstetrics and Gynecology

## 2024-01-08 DIAGNOSIS — N938 Other specified abnormal uterine and vaginal bleeding: Secondary | ICD-10-CM | POA: Diagnosis present

## 2024-01-08 DIAGNOSIS — I10 Essential (primary) hypertension: Secondary | ICD-10-CM | POA: Diagnosis not present

## 2024-01-08 DIAGNOSIS — E6689 Other obesity not elsewhere classified: Secondary | ICD-10-CM | POA: Insufficient documentation

## 2024-01-08 DIAGNOSIS — Z6841 Body Mass Index (BMI) 40.0 and over, adult: Secondary | ICD-10-CM | POA: Insufficient documentation

## 2024-01-08 DIAGNOSIS — N840 Polyp of corpus uteri: Secondary | ICD-10-CM | POA: Insufficient documentation

## 2024-01-08 DIAGNOSIS — K219 Gastro-esophageal reflux disease without esophagitis: Secondary | ICD-10-CM | POA: Insufficient documentation

## 2024-01-08 DIAGNOSIS — Z8249 Family history of ischemic heart disease and other diseases of the circulatory system: Secondary | ICD-10-CM | POA: Diagnosis not present

## 2024-01-08 DIAGNOSIS — N939 Abnormal uterine and vaginal bleeding, unspecified: Secondary | ICD-10-CM

## 2024-01-08 HISTORY — PX: DILATATION & CURETTAGE/HYSTEROSCOPY WITH MYOSURE: SHX6511

## 2024-01-08 LAB — BASIC METABOLIC PANEL
Anion gap: 9 (ref 5–15)
BUN: 12 mg/dL (ref 6–20)
CO2: 24 mmol/L (ref 22–32)
Calcium: 8.8 mg/dL — ABNORMAL LOW (ref 8.9–10.3)
Chloride: 105 mmol/L (ref 98–111)
Creatinine, Ser: 0.8 mg/dL (ref 0.44–1.00)
GFR, Estimated: >60 mL/min (ref >60)
Glucose, Bld: 108 mg/dL — ABNORMAL HIGH (ref 70–99)
Potassium: 3.9 mmol/L (ref 3.5–5.1)
Sodium: 138 mmol/L (ref 135–145)

## 2024-01-08 LAB — CBC
HCT: 36.5 % (ref 36.0–46.0)
Hemoglobin: 11.7 g/dL — ABNORMAL LOW (ref 12.0–15.0)
MCH: 27.3 pg (ref 26.0–34.0)
MCHC: 32.1 g/dL (ref 30.0–36.0)
MCV: 85.1 fL (ref 80.0–100.0)
Platelets: 303 10*3/uL (ref 150–400)
RBC: 4.29 MIL/uL (ref 3.87–5.11)
RDW: 13.2 % (ref 11.5–15.5)
WBC: 7.2 10*3/uL (ref 4.0–10.5)
nRBC: 0 % (ref 0.0–0.2)

## 2024-01-08 SURGERY — DILATATION & CURETTAGE/HYSTEROSCOPY WITH MYOSURE
Anesthesia: General | Site: Vagina

## 2024-01-08 MED ORDER — MIDAZOLAM HCL 2 MG/2ML IJ SOLN
INTRAMUSCULAR | Status: AC
  Filled 2024-01-08: qty 2

## 2024-01-08 MED ORDER — PHENYLEPHRINE 80 MCG/ML (10ML) SYRINGE FOR IV PUSH (FOR BLOOD PRESSURE SUPPORT)
PREFILLED_SYRINGE | INTRAVENOUS | Status: DC | PRN
  Administered 2024-01-08: 120 ug via INTRAVENOUS
  Administered 2024-01-08: 80 ug via INTRAVENOUS
  Administered 2024-01-08 (×2): 120 ug via INTRAVENOUS

## 2024-01-08 MED ORDER — OXYCODONE HCL 5 MG/5ML PO SOLN: 5.0000 mg | Freq: Once | ORAL | Status: DC | PRN

## 2024-01-08 MED ORDER — ONDANSETRON HCL 4 MG/2ML IJ SOLN
INTRAMUSCULAR | Status: AC
  Filled 2024-01-08: qty 2

## 2024-01-08 MED ORDER — EPHEDRINE SULFATE-NACL 50-0.9 MG/10ML-% IV SOSY
PREFILLED_SYRINGE | INTRAVENOUS | Status: DC | PRN
  Administered 2024-01-08: 5 mg via INTRAVENOUS

## 2024-01-08 MED ORDER — CHLORHEXIDINE GLUCONATE 0.12 % MT SOLN
15.0000 mL | Freq: Once | OROMUCOSAL | Status: AC
  Administered 2024-01-08: 15 mL via OROMUCOSAL
  Filled 2024-01-08: qty 15

## 2024-01-08 MED ORDER — KETOROLAC TROMETHAMINE 30 MG/ML IJ SOLN
INTRAMUSCULAR | Status: DC | PRN
Start: 2024-01-08 — End: 2024-01-08
  Administered 2024-01-08: 30 mg via INTRAVENOUS

## 2024-01-08 MED ORDER — MIDAZOLAM HCL 2 MG/2ML IJ SOLN
INTRAMUSCULAR | Status: DC | PRN
  Administered 2024-01-08: 2 mg via INTRAVENOUS

## 2024-01-08 MED ORDER — DEXAMETHASONE SODIUM PHOSPHATE 10 MG/ML IJ SOLN
INTRAMUSCULAR | Status: DC | PRN
  Administered 2024-01-08: 10 mg via INTRAVENOUS

## 2024-01-08 MED ORDER — MEPERIDINE HCL 25 MG/ML IJ SOLN: 6.2500 mg | INTRAMUSCULAR | Status: DC | PRN

## 2024-01-08 MED ORDER — PROPOFOL 10 MG/ML IV BOLUS
INTRAVENOUS | Status: AC
  Filled 2024-01-08: qty 20

## 2024-01-08 MED ORDER — LACTATED RINGERS IV SOLN: INTRAVENOUS | Status: DC

## 2024-01-08 MED ORDER — FENTANYL CITRATE (PF) 250 MCG/5ML IJ SOLN
INTRAMUSCULAR | Status: AC
  Filled 2024-01-08: qty 5

## 2024-01-08 MED ORDER — FENTANYL CITRATE (PF) 250 MCG/5ML IJ SOLN
INTRAMUSCULAR | Status: DC | PRN
  Administered 2024-01-08: 50 ug via INTRAVENOUS

## 2024-01-08 MED ORDER — ONDANSETRON HCL 4 MG/2ML IJ SOLN: 4.0000 mg | Freq: Once | INTRAMUSCULAR | Status: DC | PRN

## 2024-01-08 MED ORDER — IBUPROFEN 800 MG PO TABS: 800.0000 mg | ORAL_TABLET | Freq: Four times a day (QID) | ORAL | 11 refills | Status: AC | PRN

## 2024-01-08 MED ORDER — HYDROMORPHONE HCL 1 MG/ML IJ SOLN
INTRAMUSCULAR | Status: AC
  Filled 2024-01-08: qty 1

## 2024-01-08 MED ORDER — LIDOCAINE 2% (20 MG/ML) 5 ML SYRINGE
INTRAMUSCULAR | Status: DC | PRN
  Administered 2024-01-08: 60 mg via INTRAVENOUS

## 2024-01-08 MED ORDER — OXYCODONE HCL 5 MG PO TABS: 5.0000 mg | ORAL_TABLET | Freq: Once | ORAL | Status: DC | PRN

## 2024-01-08 MED ORDER — HYDROMORPHONE HCL 1 MG/ML IJ SOLN
0.2500 mg | INTRAMUSCULAR | Status: DC | PRN
  Administered 2024-01-08 (×2): 0.5 mg via INTRAVENOUS

## 2024-01-08 MED ORDER — ORAL CARE MOUTH RINSE: 15.0000 mL | Freq: Once | OROMUCOSAL | Status: AC

## 2024-01-08 MED ORDER — KETOROLAC TROMETHAMINE 30 MG/ML IJ SOLN: 30.0000 mg | Freq: Once | INTRAMUSCULAR | Status: DC | PRN

## 2024-01-08 MED ORDER — ACETAMINOPHEN 500 MG PO TABS
1000.0000 mg | ORAL_TABLET | Freq: Once | ORAL | Status: AC
  Administered 2024-01-08: 1000 mg via ORAL
  Filled 2024-01-08: qty 2

## 2024-01-08 MED ORDER — LIDOCAINE 2% (20 MG/ML) 5 ML SYRINGE
INTRAMUSCULAR | Status: AC
  Filled 2024-01-08: qty 5

## 2024-01-08 MED ORDER — PROPOFOL 10 MG/ML IV BOLUS
INTRAVENOUS | Status: DC | PRN
  Administered 2024-01-08: 300 mg via INTRAVENOUS
  Administered 2024-01-08: 50 ug/kg/min via INTRAVENOUS

## 2024-01-08 MED ORDER — ONDANSETRON HCL 4 MG/2ML IJ SOLN
INTRAMUSCULAR | Status: DC | PRN
  Administered 2024-01-08: 4 mg via INTRAVENOUS

## 2024-01-08 MED ORDER — AMISULPRIDE (ANTIEMETIC) 5 MG/2ML IV SOLN: 10.0000 mg | Freq: Once | INTRAVENOUS | Status: DC | PRN

## 2024-01-08 SURGICAL SUPPLY — 13 items
CANISTER SUCT 3000ML PPV (MISCELLANEOUS) ×1 IMPLANT
CATH ROBINSON RED A/P 16FR (CATHETERS) ×1 IMPLANT
DEVICE MYOSURE LITE (MISCELLANEOUS) IMPLANT
GLOVE ECLIPSE 6.5 STRL STRAW (GLOVE) ×1 IMPLANT
GLOVE SURG UNDER POLY LF SZ7 (GLOVE) ×2 IMPLANT
GOWN STRL REUS W/ TWL LRG LVL3 (GOWN DISPOSABLE) ×2 IMPLANT
KIT PROCEDURE FLUENT (KITS) ×1 IMPLANT
KIT TURNOVER KIT B (KITS) ×1 IMPLANT
PACK VAGINAL MINOR WOMEN LF (CUSTOM PROCEDURE TRAY) ×1 IMPLANT
PAD OB MATERNITY 11 LF (PERSONAL CARE ITEMS) ×1 IMPLANT
SEAL ROD LENS SCOPE MYOSURE (ABLATOR) ×1 IMPLANT
TOWEL GREEN STERILE FF (TOWEL DISPOSABLE) ×2 IMPLANT
UNDERPAD 30X36 HEAVY ABSORB (UNDERPADS AND DIAPERS) ×1 IMPLANT

## 2024-01-08 NOTE — Transfer of Care (Signed)
 Immediate Anesthesia Transfer of Care Note  Patient: Christina Payne  Procedure(s) Performed: DILATATION & CURETTAGE/HYSTEROSCOPY WITH MYOSURE (Vagina )  Patient Location: PACU  Anesthesia Type:General  Level of Consciousness: awake and alert   Airway & Oxygen Therapy: Patient Spontanous Breathing  Post-op Assessment: Report given to RN  Post vital signs: Reviewed and stable  Last Vitals:  Vitals Value Taken Time  BP    Temp    Pulse 99 01/08/24 0829  Resp 20 01/08/24 0830  SpO2 98 % 01/08/24 0829  Vitals shown include unfiled device data.  Last Pain:  Vitals:   01/08/24 0616  TempSrc:   PainSc: 0-No pain         Complications: No notable events documented.

## 2024-01-08 NOTE — Discharge Instructions (Signed)
CALL  IF TEMP>100.4, NOTHING PER VAGINA X 1 WK, CALL IF SOAKING A MAXI  PAD EVERY HOUR OR MORE FREQUENTLY 

## 2024-01-08 NOTE — Brief Op Note (Signed)
 01/08/2024  8:35 AM  PATIENT:  Christina Payne  38 y.o. female  PRE-OPERATIVE DIAGNOSIS:  Dysfunctional uterine bleeding, endometrial polyp/mass  POST-OPERATIVE DIAGNOSIS:  Dysfunctional uterine bleeding, endometrial polyp/mass  PROCEDURE:  diagnostic hysteroscopy, hysteroscopic resection of endometrial polyp using myosure, dilation and curettage  SURGEON:  Surgeons and Role:    * Maxie Better, MD - Primary  PHYSICIAN ASSISTANT:   ASSISTANTS: none   ANESTHESIA:   general  EBL:  5 mL   BLOOD ADMINISTERED:none  DRAINS: none   LOCAL MEDICATIONS USED:  NONE  SPECIMEN:  Source of Specimen:  EMC with polyp  DISPOSITION OF SPECIMEN:  PATHOLOGY  COUNTS:  YES  TOURNIQUET:  * No tourniquets in log *  DICTATION: .Other Dictation: Dictation Number [id number]  PLAN OF CARE: Discharge to home after PACU  PATIENT DISPOSITION:  PACU - hemodynamically stable.   Delay start of Pharmacological VTE agent (>24hrs) due to surgical blood loss or risk of bleeding: no

## 2024-01-08 NOTE — Anesthesia Postprocedure Evaluation (Signed)
 Anesthesia Post Note  Patient: Christina Payne  Procedure(s) Performed: DILATATION & CURETTAGE/HYSTEROSCOPY WITH MYOSURE (Vagina )     Patient location during evaluation: PACU Anesthesia Type: General Level of consciousness: awake and alert, oriented and patient cooperative Pain management: pain level controlled Vital Signs Assessment: post-procedure vital signs reviewed and stable Respiratory status: spontaneous breathing, nonlabored ventilation and respiratory function stable Cardiovascular status: blood pressure returned to baseline and stable Postop Assessment: no apparent nausea or vomiting Anesthetic complications: no   No notable events documented.  Last Vitals:  Vitals:   01/08/24 0845 01/08/24 0900  BP: (!) 136/90 132/83  Pulse: 89 87  Resp: 17 12  Temp:  36.8 C  SpO2: 98% 100%    Last Pain:  Vitals:   01/08/24 0616  TempSrc:   PainSc: 0-No pain                 Lannie Fields

## 2024-01-08 NOTE — Anesthesia Preprocedure Evaluation (Addendum)
 Anesthesia Evaluation  Patient identified by MRN, date of birth, ID band Patient awake    Reviewed: Allergy & Precautions, NPO status , Patient's Chart, lab work & pertinent test results  Airway Mallampati: III  TM Distance: >3 FB Neck ROM: Full    Dental no notable dental hx. (+) Teeth Intact, Dental Advisory Given   Pulmonary neg pulmonary ROS   Pulmonary exam normal breath sounds clear to auscultation       Cardiovascular hypertension (133/82 preop), Pt. on medications Normal cardiovascular exam Rhythm:Regular Rate:Normal     Neuro/Psych negative neurological ROS  negative psych ROS   GI/Hepatic Neg liver ROS,GERD  Controlled,,  Endo/Other  diabetes  Class 4 obesityBMI 48  Renal/GU negative Renal ROS  negative genitourinary   Musculoskeletal negative musculoskeletal ROS (+)    Abdominal  (+) + obese  Peds negative pediatric ROS (+)  Hematology  (+) Blood dyscrasia, anemia Hb 11.7, plt 303   Anesthesia Other Findings   Reproductive/Obstetrics negative OB ROS                              Anesthesia Physical Anesthesia Plan  ASA: 3  Anesthesia Plan: General   Post-op Pain Management: Tylenol PO (pre-op)* and Toradol IV (intra-op)*   Induction: Intravenous  PONV Risk Score and Plan: 3 and Ondansetron, Dexamethasone, Midazolam and Treatment may vary due to age or medical condition  Airway Management Planned: LMA  Additional Equipment: None  Intra-op Plan:   Post-operative Plan: Extubation in OR  Informed Consent: I have reviewed the patients History and Physical, chart, labs and discussed the procedure including the risks, benefits and alternatives for the proposed anesthesia with the patient or authorized representative who has indicated his/her understanding and acceptance.     Dental advisory given  Plan Discussed with: CRNA  Anesthesia Plan Comments:          Anesthesia Quick Evaluation

## 2024-01-08 NOTE — Op Note (Signed)
 Christina Payne, Christina Payne MEDICAL RECORD NO: [id number] ACCOUNT NO: [id number] DATE OF BIRTH: June 16, 1986 FACILITY: [facility] LOCATION: [facility] PHYSICIAN: Aedin Jeansonne A. Cherly Hensen, MD  Operative Report   DATE OF PROCEDURE: 01/08/2024  PREOPERATIVE DIAGNOSES:  Dysfunctional uterine bleeding, endometrial mass.  PROCEDURE:  Diagnostic hysteroscopy, hysteroscopic resection of endometrial polyp using MyoSure, dilation and curettage.  POSTOPERATIVE DIAGNOSES:  Dysfunctional uterine bleeding, endometrial polyp.  ANESTHESIA:  General.  SURGEON:  Fitzhugh Vizcarrondo A. Cherly Hensen, MD  ASSISTANT:  None.  DESCRIPTION OF PROCEDURE:  Under adequate general anesthesia, the patient was placed in the dorsal lithotomy position.  She was sterilely prepped and draped in usual fashion.  The bladder was catheterized with a moderate amount of urine.  Bivalve  speculum was placed in the vagina.  Single-tooth tenaculum was placed on the anterior lip of the cervix.  The cervix was serially dilated up to #19 [facility] dilator.  A diagnostic hysteroscope was introduced into the uterine cavity.  Both tubal ostia could  be seen.  Posterior wall was thickened with some polypoid lesions present as well as on the right lateral wall.  Using the LITE resectoscope, the entire cavity was resected including the endometrial polyps.  The patient had multiple nabothian cysts in  the cervix.  The procedure was felt to be complete, all instruments were then removed from the vagina.  SPECIMEN:  Labeled endometrial curetting with polyps were sent to pathology.  ESTIMATED BLOOD LOSS:  2 mL.  FLUID DEFICIT:  220 mL.  COMPLICATIONS:  None.  CONDITION:  The patient tolerated the procedure well and was transferred to the recovery in stable condition.   VAI D: 01/08/2024 8:33:42 am T: 01/08/2024 8:45:00 am  JOB: [id number]/ [id number]

## 2024-01-08 NOTE — H&P (Signed)
 Christina Payne is an 38 y.o. female. [id number] MBF presents for surgical mgmt of DUB, endometrial mass. Pt is scheduled for dx hysteroscopy, D&C, hysteroscopic resection of endometrial mass using myosure  Pertinent Gynecological History: Menses:  dub Bleeding: dysfunctional uterine bleeding Contraception: none DES exposure: denies Blood transfusions: none Sexually transmitted diseases: no past history Previous GYN Procedures:  c/s x2   Last mammogram:  n/Christina  Date: n/Christina Last pap: normal Date: 2024 OB History: G4, P3   Menstrual History: Menarche age: n/Christina Patient's last menstrual period was 12/13/2023 (exact date).    Past Medical History:  Diagnosis Date   Abnormal Pap smear    Dr. Clearance Coots   Allergic rhinitis    Contraception management    [facility], Dr. Clearance Coots   Dysmenorrhea    on Implanon + OCP per Dr. Clearance Coots   Essential hypertension 11/30/2020   GERD (gastroesophageal reflux disease)    Gestational diabetes mellitus (GDM), antepartum    Hypertension    hospitalized 11/01/10   Morbid obesity (HCC) 11/30/2020   Obesity    Pulmonary edema 12/11   postpartum, hospitalized;     Shortness of breath 03/07/2021   Wears glasses     Past Surgical History:  Procedure Laterality Date   CESAREAN SECTION  01/10/2008   CESAREAN SECTION  01/30/2016   Procedure: CESAREAN SECTION;  Surgeon: Myna Hidalgo, DO;  Location: [facility];  Service: Obstetrics;;   WISDOM TOOTH EXTRACTION      Family History  Problem Relation Age of Onset   Hypertension Mother    Fibromyalgia Mother    Asthma Mother    Allergic rhinitis Mother    Other Father        unknown   Other Sister        died at birth, mother had eclampsia   Allergic rhinitis Brother    Other Brother        auditory processing disorder, eating disorder, sleeping disorder   Cancer Paternal Grandmother        lung   Diabetes Paternal Grandmother    Heart disease Neg Hx     Social History:  reports that she has  never smoked. She has never used smokeless tobacco. She reports current alcohol use of about 2.0 standard drinks of alcohol per week. She reports that she does not use drugs.  Allergies: No Known Allergies  No medications prior to admission.    Review of Systems  All other systems reviewed and are negative.   Last menstrual period 12/13/2023, unknown if currently breastfeeding. Physical Exam Constitutional:      Appearance: Normal appearance. She is obese.  Eyes:     Extraocular Movements: Extraocular movements intact.  Cardiovascular:     Rate and Rhythm: Regular rhythm.     Heart sounds: Normal heart sounds.  Pulmonary:     Breath sounds: Normal breath sounds.  Abdominal:     Comments: Obese soft. Pfannenstiel incision  Genitourinary:    General: Normal vulva.     Comments: Vagina: no discharge Cervix parous Uterus AF 10 wk  Adnexa no palpable mass Musculoskeletal:        General: Normal range of motion.  Skin:    General: Skin is warm and dry.  Neurological:     General: No focal deficit present.     Mental Status: She is alert and oriented to person, place, and time.     No results found for this or any previous visit (from the past 24 hours).  No results found.  Assessment/Plan: Dub Endometrial mass Chronic HTN P) dx hysteroscopy, hysteroscopic resection of endometrial mass using myosure, dilation and curettage. Procedure explained. Risk of surgery reviewed including infection, bleeding, injury to surrounding organ structures, thermal injury, fluid overload and its mgmt, uterine perforation ( 11/998) and its risk. All ? answered  Christina Payne Christina Payne 01/08/2024, 4:30 AM

## 2024-01-08 NOTE — Anesthesia Procedure Notes (Signed)
 Procedure Name: LMA Insertion Date/Time: 01/08/2024 7:43 AM  Performed by: Randa Evens, CRNAPre-anesthesia Checklist: Patient identified, Emergency Drugs available, Suction available and Patient being monitored Patient Re-evaluated:Patient Re-evaluated prior to induction Oxygen Delivery Method: Circle System Utilized Preoxygenation: Pre-oxygenation with 100% oxygen Induction Type: IV induction Ventilation: Mask ventilation without difficulty LMA: LMA inserted LMA Size: 4.0 Number of attempts: 1 Airway Equipment and Method: Bite block Placement Confirmation: positive ETCO2 Tube secured with: Tape Dental Injury: Teeth and Oropharynx as per pre-operative assessment

## 2024-01-08 NOTE — Interval H&P Note (Signed)
 History and Physical Interval Note:  01/08/2024 7:25 AM  Christina Payne  has presented today for surgery, with the diagnosis of Dysfunctional uterine bleeding, endometrial polyp/mass.  The various methods of treatment have been discussed with the patient and family. After consideration of risks, benefits and other options for treatment, the patient has consented to  Procedure(s): DILATATION & CURETTAGE/HYSTEROSCOPY WITH MYOSURE (N/A) as a surgical intervention.  The patient's history has been reviewed, patient examined, no change in status, stable for surgery.  I have reviewed the patient's chart and labs.  Questions were answered to the patient's satisfaction.     Kip Cropp A Ellwood Steidle

## 2024-01-08 NOTE — Progress Notes (Signed)
 UA preg is negative. Not able to document due to malfunction of the glucometer.

## 2024-01-09 ENCOUNTER — Encounter (HOSPITAL_COMMUNITY): Payer: Self-pay | Admitting: Obstetrics and Gynecology

## 2024-01-09 LAB — POCT PREGNANCY, URINE: Preg Test, Ur: NEGATIVE

## 2024-01-09 LAB — SURGICAL PATHOLOGY
